# Patient Record
Sex: Male | Born: 1945
Health system: Southern US, Community
[De-identification: ages and names within clinical notes are randomized; demographics above are authoritative.]

## PROBLEM LIST (undated history)

## (undated) DIAGNOSIS — I10 Essential (primary) hypertension: Secondary | ICD-10-CM

## (undated) DIAGNOSIS — E119 Type 2 diabetes mellitus without complications: Secondary | ICD-10-CM

## (undated) DIAGNOSIS — I4891 Unspecified atrial fibrillation: Secondary | ICD-10-CM

---

## 2005-01-17 ENCOUNTER — Inpatient Hospital Stay (HOSPITAL_COMMUNITY): Admission: EM | Admit: 2005-01-17 | Discharge: 2005-01-19 | Payer: Self-pay | Admitting: Internal Medicine

## 2005-01-17 ENCOUNTER — Ambulatory Visit: Payer: Self-pay | Admitting: Cardiology

## 2005-01-24 ENCOUNTER — Inpatient Hospital Stay (HOSPITAL_COMMUNITY): Admission: AD | Admit: 2005-01-24 | Discharge: 2005-01-29 | Payer: Self-pay | Admitting: Cardiology

## 2005-01-24 ENCOUNTER — Ambulatory Visit: Payer: Self-pay | Admitting: Cardiology

## 2020-05-23 ENCOUNTER — Encounter (HOSPITAL_BASED_OUTPATIENT_CLINIC_OR_DEPARTMENT_OTHER): Payer: Self-pay | Admitting: Emergency Medicine

## 2020-05-23 ENCOUNTER — Other Ambulatory Visit: Payer: Self-pay

## 2020-05-23 ENCOUNTER — Emergency Department (HOSPITAL_BASED_OUTPATIENT_CLINIC_OR_DEPARTMENT_OTHER): Payer: Medicare Other

## 2020-05-23 ENCOUNTER — Inpatient Hospital Stay (HOSPITAL_BASED_OUTPATIENT_CLINIC_OR_DEPARTMENT_OTHER)
Admission: EM | Admit: 2020-05-23 | Discharge: 2020-05-25 | DRG: 308 | Disposition: A | Discharge status: Home / Self Care | Payer: Medicare Other | Attending: Internal Medicine | Admitting: Internal Medicine

## 2020-05-23 DIAGNOSIS — I110 Hypertensive heart disease with heart failure: Secondary | ICD-10-CM | POA: Diagnosis not present

## 2020-05-23 DIAGNOSIS — I248 Other forms of acute ischemic heart disease: Secondary | ICD-10-CM | POA: Diagnosis not present

## 2020-05-23 DIAGNOSIS — Z955 Presence of coronary angioplasty implant and graft: Secondary | ICD-10-CM

## 2020-05-23 DIAGNOSIS — R7989 Other specified abnormal findings of blood chemistry: Secondary | ICD-10-CM | POA: Diagnosis not present

## 2020-05-23 DIAGNOSIS — I509 Heart failure, unspecified: Secondary | ICD-10-CM | POA: Diagnosis not present

## 2020-05-23 DIAGNOSIS — I517 Cardiomegaly: Secondary | ICD-10-CM | POA: Diagnosis not present

## 2020-05-23 DIAGNOSIS — I313 Pericardial effusion (noninflammatory): Secondary | ICD-10-CM | POA: Diagnosis present

## 2020-05-23 DIAGNOSIS — E782 Mixed hyperlipidemia: Secondary | ICD-10-CM | POA: Diagnosis present

## 2020-05-23 DIAGNOSIS — I5033 Acute on chronic diastolic (congestive) heart failure: Secondary | ICD-10-CM | POA: Diagnosis not present

## 2020-05-23 DIAGNOSIS — I252 Old myocardial infarction: Secondary | ICD-10-CM | POA: Diagnosis not present

## 2020-05-23 DIAGNOSIS — E1165 Type 2 diabetes mellitus with hyperglycemia: Secondary | ICD-10-CM | POA: Diagnosis not present

## 2020-05-23 DIAGNOSIS — J811 Chronic pulmonary edema: Secondary | ICD-10-CM | POA: Diagnosis not present

## 2020-05-23 DIAGNOSIS — Z7982 Long term (current) use of aspirin: Secondary | ICD-10-CM | POA: Diagnosis not present

## 2020-05-23 DIAGNOSIS — I77810 Thoracic aortic ectasia: Secondary | ICD-10-CM | POA: Diagnosis not present

## 2020-05-23 DIAGNOSIS — I480 Paroxysmal atrial fibrillation: Secondary | ICD-10-CM | POA: Diagnosis not present

## 2020-05-23 DIAGNOSIS — I10 Essential (primary) hypertension: Secondary | ICD-10-CM

## 2020-05-23 DIAGNOSIS — Z7901 Long term (current) use of anticoagulants: Secondary | ICD-10-CM | POA: Diagnosis not present

## 2020-05-23 DIAGNOSIS — Z7984 Long term (current) use of oral hypoglycemic drugs: Secondary | ICD-10-CM

## 2020-05-23 DIAGNOSIS — I2510 Atherosclerotic heart disease of native coronary artery without angina pectoris: Secondary | ICD-10-CM | POA: Diagnosis not present

## 2020-05-23 DIAGNOSIS — Z79899 Other long term (current) drug therapy: Secondary | ICD-10-CM | POA: Diagnosis not present

## 2020-05-23 DIAGNOSIS — Z20822 Contact with and (suspected) exposure to covid-19: Secondary | ICD-10-CM | POA: Diagnosis present

## 2020-05-23 DIAGNOSIS — I5043 Acute on chronic combined systolic (congestive) and diastolic (congestive) heart failure: Secondary | ICD-10-CM | POA: Diagnosis present

## 2020-05-23 DIAGNOSIS — R778 Other specified abnormalities of plasma proteins: Secondary | ICD-10-CM | POA: Diagnosis not present

## 2020-05-23 DIAGNOSIS — I4891 Unspecified atrial fibrillation: Secondary | ICD-10-CM

## 2020-05-23 DIAGNOSIS — R0602 Shortness of breath: Secondary | ICD-10-CM | POA: Diagnosis not present

## 2020-05-23 DIAGNOSIS — Z9114 Patient's other noncompliance with medication regimen: Secondary | ICD-10-CM

## 2020-05-23 HISTORY — DX: Essential (primary) hypertension: I10

## 2020-05-23 HISTORY — DX: Unspecified atrial fibrillation: I48.91

## 2020-05-23 HISTORY — DX: Type 2 diabetes mellitus without complications: E11.9

## 2020-05-23 HISTORY — DX: Type 2 diabetes mellitus with hyperglycemia: E11.65

## 2020-05-23 HISTORY — DX: Atherosclerotic heart disease of native coronary artery without angina pectoris: I25.10

## 2020-05-23 HISTORY — DX: Mixed hyperlipidemia: E78.2

## 2020-05-23 LAB — RESP PANEL BY RT-PCR (FLU A&B, COVID) ARPGX2
Influenza A by PCR: NEGATIVE
Influenza B by PCR: NEGATIVE
SARS Coronavirus 2 by RT PCR: NEGATIVE

## 2020-05-23 LAB — CBC WITH DIFFERENTIAL/PLATELET
Abs Immature Granulocytes: 0.02 K/uL (ref 0.00–0.07)
Basophils Absolute: 0 K/uL (ref 0.0–0.1)
Basophils Relative: 0 %
Eosinophils Absolute: 0.2 K/uL (ref 0.0–0.5)
Eosinophils Relative: 2 %
HCT: 40.8 % (ref 39.0–52.0)
Hemoglobin: 13.2 g/dL (ref 13.0–17.0)
Immature Granulocytes: 0 %
Lymphocytes Relative: 20 %
Lymphs Abs: 1.5 K/uL (ref 0.7–4.0)
MCH: 30.1 pg (ref 26.0–34.0)
MCHC: 32.4 g/dL (ref 30.0–36.0)
MCV: 92.9 fL (ref 80.0–100.0)
Monocytes Absolute: 0.7 K/uL (ref 0.1–1.0)
Monocytes Relative: 9 %
Neutro Abs: 5.1 K/uL (ref 1.7–7.7)
Neutrophils Relative %: 69 %
Platelets: 180 K/uL (ref 150–400)
RBC: 4.39 MIL/uL (ref 4.22–5.81)
RDW: 14.8 % (ref 11.5–15.5)
WBC: 7.4 K/uL (ref 4.0–10.5)
nRBC: 0 % (ref 0.0–0.2)

## 2020-05-23 LAB — COMPREHENSIVE METABOLIC PANEL WITH GFR
ALT: 17 U/L (ref 0–44)
AST: 24 U/L (ref 15–41)
Albumin: 4.1 g/dL (ref 3.5–5.0)
Alkaline Phosphatase: 50 U/L (ref 38–126)
Anion gap: 12 (ref 5–15)
BUN: 13 mg/dL (ref 8–23)
CO2: 23 mmol/L (ref 22–32)
Calcium: 9.6 mg/dL (ref 8.9–10.3)
Chloride: 102 mmol/L (ref 98–111)
Creatinine, Ser: 0.65 mg/dL (ref 0.61–1.24)
GFR, Estimated: >60 mL/min (ref >60)
Glucose, Bld: 287 mg/dL — ABNORMAL HIGH (ref 70–99)
Potassium: 3.8 mmol/L (ref 3.5–5.1)
Sodium: 137 mmol/L (ref 135–145)
Total Bilirubin: 1.2 mg/dL (ref 0.3–1.2)
Total Protein: 7.1 g/dL (ref 6.5–8.1)

## 2020-05-23 LAB — BRAIN NATRIURETIC PEPTIDE: B Natriuretic Peptide: 170.2 pg/mL — ABNORMAL HIGH (ref 0.0–100.0)

## 2020-05-23 LAB — D-DIMER, QUANTITATIVE: D-Dimer, Quant: 0.64 ug/mL-FEU — ABNORMAL HIGH (ref 0.00–0.50)

## 2020-05-23 LAB — MAGNESIUM: Magnesium: 1.7 mg/dL (ref 1.7–2.4)

## 2020-05-23 LAB — TROPONIN I (HIGH SENSITIVITY)
Troponin I (High Sensitivity): 30 ng/L — ABNORMAL HIGH (ref <18)
Troponin I (High Sensitivity): 32 ng/L — ABNORMAL HIGH (ref <18)

## 2020-05-23 LAB — GLUCOSE, CAPILLARY: Glucose-Capillary: 256 mg/dL — ABNORMAL HIGH (ref 70–99)

## 2020-05-23 LAB — TSH: TSH: 2.19 u[IU]/mL (ref 0.350–4.500)

## 2020-05-23 MED ORDER — SODIUM CHLORIDE 0.9 % IV SOLN
INTRAVENOUS | Status: DC | PRN
  Administered 2020-05-23: 500 mL via INTRAVENOUS

## 2020-05-23 MED ORDER — HYDRALAZINE HCL 20 MG/ML IJ SOLN: 10.0000 mg | Freq: Four times a day (QID) | INTRAMUSCULAR | Status: DC | PRN

## 2020-05-23 MED ORDER — DULOXETINE HCL 60 MG PO CPEP
60.0000 mg | ORAL_CAPSULE | Freq: Every evening | ORAL | Status: DC
  Administered 2020-05-23 – 2020-05-24 (×2): 60 mg via ORAL
  Filled 2020-05-23 (×2): qty 1

## 2020-05-23 MED ORDER — DILTIAZEM HCL-DEXTROSE 125-5 MG/125ML-% IV SOLN (PREMIX)
5.0000 mg/h | INTRAVENOUS | Status: AC
  Administered 2020-05-23: 5 mg/h via INTRAVENOUS
  Filled 2020-05-23: qty 125

## 2020-05-23 MED ORDER — ONDANSETRON HCL 4 MG/2ML IJ SOLN: 4.0000 mg | Freq: Four times a day (QID) | INTRAMUSCULAR | Status: DC | PRN

## 2020-05-23 MED ORDER — MAGNESIUM SULFATE IN D5W 1-5 GM/100ML-% IV SOLN
1.0000 g | Freq: Once | INTRAVENOUS | Status: AC
  Administered 2020-05-23: 1 g via INTRAVENOUS
  Filled 2020-05-23: qty 100

## 2020-05-23 MED ORDER — GABAPENTIN 600 MG PO TABS
600.0000 mg | ORAL_TABLET | Freq: Three times a day (TID) | ORAL | Status: DC
  Administered 2020-05-23 – 2020-05-25 (×5): 600 mg via ORAL
  Filled 2020-05-23 (×5): qty 1

## 2020-05-23 MED ORDER — POTASSIUM CHLORIDE CRYS ER 20 MEQ PO TBCR
20.0000 meq | EXTENDED_RELEASE_TABLET | Freq: Once | ORAL | Status: AC
  Administered 2020-05-23: 20 meq via ORAL
  Filled 2020-05-23: qty 1

## 2020-05-23 MED ORDER — FUROSEMIDE 10 MG/ML IJ SOLN
40.0000 mg | Freq: Once | INTRAMUSCULAR | Status: AC
  Administered 2020-05-23: 40 mg via INTRAVENOUS
  Filled 2020-05-23: qty 4

## 2020-05-23 MED ORDER — INSULIN ASPART 100 UNIT/ML ~~LOC~~ SOLN
0.0000 [IU] | Freq: Three times a day (TID) | SUBCUTANEOUS | Status: DC
  Administered 2020-05-23: 8 [IU] via SUBCUTANEOUS
  Administered 2020-05-24: 2 [IU] via SUBCUTANEOUS
  Administered 2020-05-24 (×2): 3 [IU] via SUBCUTANEOUS
  Administered 2020-05-25: 5 [IU] via SUBCUTANEOUS
  Administered 2020-05-25: 3 [IU] via SUBCUTANEOUS

## 2020-05-23 MED ORDER — POLYETHYLENE GLYCOL 3350 17 G PO PACK: 17.0000 g | PACK | Freq: Every day | ORAL | Status: DC | PRN

## 2020-05-23 MED ORDER — APIXABAN 5 MG PO TABS
5.0000 mg | ORAL_TABLET | Freq: Two times a day (BID) | ORAL | Status: DC
  Administered 2020-05-23 – 2020-05-25 (×4): 5 mg via ORAL
  Filled 2020-05-23 (×5): qty 1

## 2020-05-23 MED ORDER — METOPROLOL TARTRATE 50 MG PO TABS
50.0000 mg | ORAL_TABLET | Freq: Two times a day (BID) | ORAL | Status: DC
  Administered 2020-05-23 – 2020-05-25 (×4): 50 mg via ORAL
  Filled 2020-05-23 (×4): qty 1

## 2020-05-23 MED ORDER — ADULT MULTIVITAMIN W/MINERALS CH
1.0000 | ORAL_TABLET | Freq: Every day | ORAL | Status: DC
  Administered 2020-05-24 – 2020-05-25 (×2): 1 via ORAL
  Filled 2020-05-23 (×2): qty 1

## 2020-05-23 MED ORDER — DILTIAZEM HCL 25 MG/5ML IV SOLN
10.0000 mg | Freq: Once | INTRAVENOUS | Status: AC
  Administered 2020-05-23: 10 mg via INTRAVENOUS

## 2020-05-23 MED ORDER — INSULIN GLARGINE 100 UNIT/ML ~~LOC~~ SOLN
10.0000 [IU] | Freq: Every day | SUBCUTANEOUS | Status: DC
  Administered 2020-05-23 – 2020-05-24 (×2): 10 [IU] via SUBCUTANEOUS
  Filled 2020-05-23 (×3): qty 0.1

## 2020-05-23 MED ORDER — ACETAMINOPHEN 325 MG PO TABS: 650.0000 mg | ORAL_TABLET | ORAL | Status: DC | PRN

## 2020-05-23 MED ORDER — ATORVASTATIN CALCIUM 40 MG PO TABS
40.0000 mg | ORAL_TABLET | Freq: Every evening | ORAL | Status: DC
  Administered 2020-05-23 – 2020-05-24 (×2): 40 mg via ORAL
  Filled 2020-05-23 (×2): qty 1

## 2020-05-23 MED ORDER — DILTIAZEM LOAD VIA INFUSION
20.0000 mg | Freq: Once | INTRAVENOUS | Status: DC
  Filled 2020-05-23: qty 20

## 2020-05-23 NOTE — ED Provider Notes (Signed)
[place] EMERGENCY DEPARTMENT Provider Note   CSN: [id number] Arrival date & time: 05/23/20  1001     History Chief Complaint  Patient presents with  . Shortness of Breath    Austin Gomez is a 75 y.o. male.  HPI 75 year old male presents with shortness of breath.  History is obtained through the Spanish interpreter via the patient as well as from the daughter at the bedside.  The patient recently came here from [place].  He was recently admitted to the hospital a few weeks ago and sounds like he had some stents placed.  He is on numerous medicines and he has a history of hypertension, diabetes, cardiac disease, atrial fibrillation (at least for the last 2 weeks) and CHF.  He endorses dyspnea for at least a week but much worse today.  He does not feel palpitations but is noted to be in A. fib with RVR.  He reports compliance with his medicines.  He is having some chest pressure over the last couple days but worse today. Some leg swelling. Just moved to the area but does not have local doctors.  Past Medical History:  Diagnosis Date  . A-fib ([place])   . Coronary artery disease   . Diabetes mellitus without complication ([place])   . Hypertension     Patient Active Problem List   Diagnosis Date Noted  . Atrial fibrillation with RVR ([place]) 05/23/2020         No family history on file.  Social History   Tobacco Use  . Smoking status: Never Smoker  . Smokeless tobacco: Never Used    Home Medications Prior to Admission medications   Not on File    Allergies    Patient has no known allergies.  Review of Systems   Review of Systems  Respiratory: Positive for shortness of breath.   Cardiovascular: Positive for chest pain and leg swelling. Negative for palpitations.  All other systems reviewed and are negative.   Physical Exam Updated Vital Signs BP 126/82   Pulse 98   Temp 97.7 F (36.5 C) (Oral)   Resp (!) 28   Ht 5\' 10"  (1.778 m)   Wt 97.9 kg    SpO2 98%   BMI 30.98 kg/m   Physical Exam Vitals and nursing note reviewed.  Constitutional:      Appearance: He is well-developed.  HENT:     Head: Normocephalic and atraumatic.     Right Ear: External ear normal.     Left Ear: External ear normal.     Nose: Nose normal.  Eyes:     General:        Right eye: No discharge.        Left eye: No discharge.  Cardiovascular:     Rate and Rhythm: Tachycardia present. Rhythm irregular.     Heart sounds: Normal heart sounds.  Pulmonary:     Effort: Pulmonary effort is normal. Tachypnea present. No accessory muscle usage or respiratory distress.     Breath sounds: Examination of the right-lower field reveals rales. Examination of the left-lower field reveals rales. Rales present.  Abdominal:     Palpations: Abdomen is soft.     Tenderness: There is no abdominal tenderness.  Musculoskeletal:     Cervical back: Neck supple.     Right lower leg: Edema present.     Left lower leg: Edema present.     Comments: Mild bilateral lower extremity edema  Skin:    General: Skin is  warm and dry.  Neurological:     Mental Status: He is alert.  Psychiatric:        Mood and Affect: Mood is not anxious.     ED Results / Procedures / Treatments   Labs (all labs ordered are listed, but only abnormal results are displayed) Labs Reviewed  COMPREHENSIVE METABOLIC PANEL - Abnormal; Notable for the following components:      Result Value   Glucose, Bld 287 (*)    All other components within normal limits  BRAIN NATRIURETIC PEPTIDE - Abnormal; Notable for the following components:   B Natriuretic Peptide 170.2 (*)    All other components within normal limits  TROPONIN I (HIGH SENSITIVITY) - Abnormal; Notable for the following components:   Troponin I (High Sensitivity) 30 (*)    All other components within normal limits  TROPONIN I (HIGH SENSITIVITY) - Abnormal; Notable for the following components:   Troponin I (High Sensitivity) 32 (*)    All  other components within normal limits  RESP PANEL BY RT-PCR (FLU A&B, COVID) [id number]  CBC WITH DIFFERENTIAL/PLATELET    EKG EKG Interpretation  Date/Time:  Wednesday May 23 2020 10:07:51 EDT Ventricular Rate:  137 PR Interval:    QRS Duration: 95 QT Interval:  332 QTC Calculation: 502 R Axis:   -45 Text Interpretation: Atrial fibrillation with RVR LAD, consider left anterior fascicular block Prolonged QT interval afib is new compared to 2007 Confirmed by Sherwood Gambler [phone]) on 05/23/2020 10:11:37 AM   Radiology DG Chest Portable 1 View  Result Date: 05/23/2020 CLINICAL DATA:  Shortness of breath.  Atrial fibrillation. EXAM: PORTABLE CHEST 1 VIEW COMPARISON:  01/27/2005 FINDINGS: Heart is enlarged. There is aortic atherosclerotic calcification. There is interstitial and early alveolar pulmonary edema. Possible small effusions. IMPRESSION: Congestive heart failure with interstitial and early alveolar pulmonary edema. Possible small effusions. Electronically Signed   By: Nelson Chimes M.D.   On: 05/23/2020 10:53    Procedures .Critical Care Performed by: Sherwood Gambler, MD Authorized by: Sherwood Gambler, MD   Critical care provider statement:    Critical care time (minutes):  35   Critical care time was exclusive of:  Separately billable procedures and treating other patients   Critical care was time spent personally by me on the following activities:  Discussions with consultants, evaluation of patient's response to treatment, examination of patient, ordering and performing treatments and interventions, ordering and review of laboratory studies, ordering and review of radiographic studies, pulse oximetry, re-evaluation of patient's condition, obtaining history from patient or surrogate and review of old charts     Medications Ordered in ED Medications  diltiazem (CARDIZEM) 125 mg in dextrose 5% 125 mL (1 mg/mL) infusion (5 mg/hr Intravenous New Bag/Given 05/23/20 1116)  0.9 %   sodium chloride infusion (500 mLs Intravenous New Bag/Given 05/23/20 1113)  diltiazem (CARDIZEM) injection 10 mg (10 mg Intravenous Given 05/23/20 1117)  furosemide (LASIX) injection 40 mg (40 mg Intravenous Given 05/23/20 1147)    ED Course  I have reviewed the triage vital signs and the nursing notes.  Pertinent labs & imaging results that were available during my care of the patient were reviewed by me and considered in my medical decision making (see chart for details).    MDM Rules/Calculators/A&P                          Patient's heart rate has been better controlled with diltiazem bolus and drip.  He was given IV Lasix for what appears to be CHF as well.  History is limited to a degree because there is no outside records and he will need this confirmed when he gets to [facility].  Otherwise he appears stable for transfer to [facility] for admission for diuresis and heart rate control and further work-up and treatment.  Discussed with hospitalist for admission (Dr. Velia Meyer). Final Clinical Impression(s) / ED Diagnoses Final diagnoses:  Atrial fibrillation with RVR ([place])  Acute congestive heart failure, unspecified heart failure type [facility])    Rx / DC Orders ED Discharge Orders    None       Sherwood Gambler, MD 05/23/20 1456

## 2020-05-23 NOTE — ED Notes (Signed)
Family members given water and snacks - per request

## 2020-05-23 NOTE — Progress Notes (Signed)
Takes Ozempic 1 unit once a week

## 2020-05-23 NOTE — ED Notes (Signed)
ED Provider at bedside. 

## 2020-05-23 NOTE — H&P (Signed)
History and Physical    Austin Gomez OEV:[id number] DOB: Nov 02, 1945 DOA: 05/23/2020  PCP: Pcp, No  Patient coming from: Home   Chief Complaint:  Chief Complaint  Patient presents with  . Shortness of Breath     HPI:    75 year old male with past medical history of paroxysmal atrial fibrillation, diastolic congestive heart failure, non-insulin-dependent diabetes mellitus type 2, hyperlipidemia, hypertension and coronary artery disease (S/P caths with stenting in 2006 and 02/2020 in [place] [place]) presenting to [facility] emergency department with complaints of shortness of breath.  Patient explains that he was diagnosed with paroxysmal atrial fibrillation nearly 2 decades ago, perhaps more.  In 2006 the patient underwent a cardiac catheterization with stent placement and states that after that his episodes of atrial fibrillation had resolved.  Of note, [facility] in February due to myocardial infarction requiring placement of 2 more stents.  In the past several weeks, the patient has noted that he has been developing bruising of the bilateral upper extremities and approximately 5 days ago stopped taking his metoprolol Eliquis and aspirin because of this.  Patient explains that since then, patient has been experiencing shortness of breath.  Patient describes that this shortness of breath as initially mild in intensity but has progressively worsened and is now severe in intensity.  Shortness of breath is worse with exertion and improved with rest.  Shortness of breath is been associated with bilateral lower extremity edema.  Patient denies any associated cough fever sick contacts, recent travel or confirmed contact with COVID-19 infection.  Upon further questioning patient does additionally complain of associated mild chest discomfort, primarily located in the right anterior chest and typically occurring with taking a deep  breath.  Because of this progressive constellation of symptoms the patient eventually presented to [facility] emergency department for evaluation.  Upon evaluation in the emergency department patient was found to be in rapid atrial fibrillation with initial heart rates noted to be as high as the 140s.  Patient was additionally found to have slightly elevated troponins of 30 and 32. Patient was initiated on a diltiazem infusion.  And the hospitalist group was then called to assess the patient for transfer to [facility] for further evaluation and management.  Patient was then accepted to a progressive bed at [facility].  Review of Systems:   Review of Systems  Respiratory: Positive for shortness of breath.   Cardiovascular: Positive for chest pain and leg swelling.  All other systems reviewed and are negative.   Past Medical History:  Diagnosis Date  . A-fib ([place])   . Coronary artery disease involving native coronary artery of native heart 05/23/2020  . Diabetes mellitus without complication ([place])   . Essential hypertension 05/23/2020  . Hypertension   . Mixed hyperlipidemia 05/23/2020  . Type 2 diabetes mellitus with hyperglycemia, without long-term current use of insulin ([place]) 05/23/2020    Past Surgical History:  Procedure Laterality Date  . CORONARY ANGIOPLASTY WITH STENT PLACEMENT       reports that he has never smoked. He has never used smokeless tobacco. No history on file for alcohol use and drug use.  No Known Allergies  Family History  Problem Relation Age of Onset  . Heart disease Neg Hx      Prior to Admission medications   Medication Sig Start Date End Date Taking? Authorizing Provider  amLODipine (NORVASC) 5 MG tablet Take 1  tablet by mouth daily. 03/13/20  Yes [provider]  aspirin 81 MG chewable tablet Chew 81 mg by mouth daily.   Yes [provider]  atorvastatin (LIPITOR) 40 MG tablet Take 40 mg by mouth every  evening.   Yes [provider]  DULoxetine (CYMBALTA) 60 MG capsule Take 60 mg by mouth every evening.   Yes [provider]  ELIQUIS 5 MG TABS tablet Take 1 tablet by mouth 2 (two) times daily. 05/14/20  Yes [provider]  furosemide (LASIX) 20 MG tablet Take 20 mg by mouth daily as needed for fluid or edema.   Yes [provider]  gabapentin (NEURONTIN) 600 MG tablet Take 600 mg by mouth 3 (three) times daily.   Yes [provider]  glipiZIDE (GLUCOTROL) 10 MG tablet Take 20 mg by mouth 2 (two) times daily before a meal.   Yes [provider]  metFORMIN (GLUCOPHAGE) 1000 MG tablet Take 1,000 mg by mouth 2 (two) times daily with a meal.   Yes [provider]  metoprolol tartrate (LOPRESSOR) 50 MG tablet Take 50 mg by mouth 2 (two) times daily.   Yes [provider]  Multiple Vitamins-Minerals (MULTIVITAMIN WITH MINERALS) tablet Take 1 tablet by mouth daily.   Yes [provider]  OZEMPIC, 1 MG/DOSE, 4 MG/3ML SOPN Inject 1 mg into the skin once a week. 05/18/20  Yes [provider]    Physical Exam: Vitals:   05/23/20 1700 05/23/20 1800 05/23/20 1900 05/23/20 2016  BP: (!) 149/88  132/81 122/79  Pulse: 94 95 88 85  Resp: (!) 26 (!) 28 (!) 27 20  Temp:    97.9 F (36.6 C)  TempSrc:    Oral  SpO2: 96% 95% 96% 97%  Weight:      Height:        Constitutional: Awake alert and oriented x3, no associated distress.   Skin: no rashes, no lesions, good skin turgor noted. Eyes: Pupils are equally reactive to light.  No evidence of scleral icterus or conjunctival pallor.  ENMT: Moist mucous membranes noted.  Posterior pharynx clear of any exudate or lesions.   Neck: normal, supple, no masses, no thyromegaly.  Slightly elevated jugular venous pulse at 30 degrees. Respiratory: Mild bibasilar rales noted.  No evidence of wheezing normal respiratory effort. No accessory muscle use.  Cardiovascular: Irregularly  irregular rate and rhythm, no murmurs / rubs / gallops.  +1 distal bilateral lower extremity pitting edema.. 2+ pedal pulses. No carotid bruits.  Chest:   Nontender without crepitus or deformity.   Back:   Nontender without crepitus or deformity. Abdomen: Abdomen is soft and nontender.  No evidence of intra-abdominal masses.  Positive bowel sounds noted in all quadrants.   Musculoskeletal: No joint deformity upper and lower extremities. Good ROM, no contractures. Normal muscle tone.  Neurologic: CN 2-12 grossly intact. Sensation intact.  Patient moving all 4 extremities spontaneously.  Patient is following all commands.  Patient is responsive to verbal stimuli.   Psychiatric: Patient exhibits normal mood with appropriate affect.  Patient seems to possess insight as to their current situation.     Labs on Admission: I have personally reviewed following labs and imaging studies -   CBC: Recent Labs  Lab 05/23/20 1018  WBC 7.4  NEUTROABS 5.1  HGB 13.2  HCT 40.8  MCV 92.9  PLT 299   Basic Metabolic Panel: Recent Labs  Lab 05/23/20 1018 05/23/20 2040  NA 137  --  K 3.8  --   CL 102  --   CO2 23  --   GLUCOSE 287*  --   BUN 13  --   CREATININE 0.65  --   CALCIUM 9.6  --   MG  --  1.7   GFR: Estimated Creatinine Clearance: 92.2 mL/min (by C-G formula based on SCr of 0.65 mg/dL). Liver Function Tests: Recent Labs  Lab 05/23/20 1018  AST 24  ALT 17  ALKPHOS 50  BILITOT 1.2  PROT 7.1  ALBUMIN 4.1   No results for input(s): LIPASE, AMYLASE in the last 168 hours. No results for input(s): AMMONIA in the last 168 hours. Coagulation Profile: No results for input(s): INR, PROTIME in the last 168 hours. Cardiac Enzymes: No results for input(s): CKTOTAL, CKMB, CKMBINDEX, TROPONINI in the last 168 hours. BNP (last 3 results) No results for input(s): PROBNP in the last 8760 hours. HbA1C: No results for input(s): HGBA1C in the last 72 hours. CBG: No results for input(s):  GLUCAP in the last 168 hours. Lipid Profile: No results for input(s): CHOL, HDL, LDLCALC, TRIG, CHOLHDL, LDLDIRECT in the last 72 hours. Thyroid Function Tests: Recent Labs    05/23/20 2040  TSH 2.190   Anemia Panel: No results for input(s): VITAMINB12, FOLATE, FERRITIN, TIBC, IRON, RETICCTPCT in the last 72 hours. Urine analysis: No results found for: COLORURINE, APPEARANCEUR, LABSPEC, PHURINE, GLUCOSEU, HGBUR, BILIRUBINUR, KETONESUR, PROTEINUR, UROBILINOGEN, NITRITE, LEUKOCYTESUR  Radiological Exams on Admission - Personally Reviewed: DG Chest Portable 1 View  Result Date: 05/23/2020 CLINICAL DATA:  Shortness of breath.  Atrial fibrillation. EXAM: PORTABLE CHEST 1 VIEW COMPARISON:  01/27/2005 FINDINGS: Heart is enlarged. There is aortic atherosclerotic calcification. There is interstitial and early alveolar pulmonary edema. Possible small effusions. IMPRESSION: Congestive heart failure with interstitial and early alveolar pulmonary edema. Possible small effusions. Electronically Signed   By: Nelson Chimes M.D.   On: 05/23/2020 10:53    EKG: Personally reviewed.  Rhythm is relation with rapid ventricular response with heart rate of 137 bpm.  No dynamic ST segment changes appreciated.  Assessment/Plan Principal Problem:   Paroxysmal atrial fibrillation with RVR ([place])   Patient presenting initially in rapid atrial fibrillation with heart rates in excess of 140  Etiology is felt to be secondary to medication noncompliance over approximate the past week  Patient seems to be responding very well to the diltiazem infusion.  After several hours of monitoring with the patient remains rate controlled we will attempt to transition patient back to home regimen of oral metoprolol.  We will continue home regimen of oral Eliquis for anticoagulation  D-dimer obtained and found to be minimally elevated, plaque rupture felt to be unlikely.  Magnesium only slightly low at 1.7, magnesium sulfate  will be provided.  TSH unremarkable  Troponins are slightly elevated but this is more likely secondary to supply demand mismatch secondary to accelerated rate.  Obtaining echocardiogram in the morning  Obtaining formal cardiology consultation in the morning   Active Problems:   Acute on chronic diastolic CHF (congestive heart failure) ([place])   Patient exhibiting trace pedal edema, slightly elevated jugular venous pulse and mild bibasilar rales on examination.   Patient found to have mild interstitial edema on chest x-ray with slightly elevated BNP of 170  To be diuresing well with 40 mg of intravenous Lasix provided by the emergency department  We will reassess volume status in the morning to determine if further doses of diuretics are necessary.  Strict input and output  monitoring  Monitoring renal function and electrolytes with serial chemistries  Daily weights    Essential hypertension   Holding home regimen of amlodipine due to ongoing diltiazem infusion  Restarting home regimen of oral metoprolol  As needed intravenous antihypertensives for markedly elevated blood pressures.    Mixed hyperlipidemia   Continuing home regimen of Lipitor    Type 2 diabetes mellitus with hyperglycemia, without long-term current use of insulin ([place])  . Patient been placed on Accu-Cheks before every meal and nightly with sliding scale insulin . Holding home regimen of hypoglycemics . Hemoglobin A1C ordered . Diabetic Diet    Coronary artery disease involving native coronary artery of native heart   Patient reports undergoing cardiac catheterization in 2006 with stent placement  Patient additionally reports experiencing a myocardial infarction in [facility] requiring catheterization and placement of 2 more stents  While patient did co  plain of some intermittent right-sided chest discomfort prior to arrival, patient is currently chest pain-free  Slightly elevated  troponins are more likely supply demand stanched secondary to elevated rate and not secondary to new plaque rupture  Resuming home regimen of Eliquis and aspirin  Continuing home regimen of statin therapy  Resuming home regimen of metoprolol  Monitoring patient on telemetry     Code Status:  Full code Family Communication: deferred   Status is: Inpatient  Remains inpatient appropriate because:Ongoing diagnostic testing needed not appropriate for outpatient work up, IV treatments appropriate due to intensity of illness or inability to take PO and Inpatient level of care appropriate due to severity of illness   Dispo: The patient is from: Home              Anticipated d/c is to: Home              Patient currently is not medically stable to d/c.   Difficult to place patient No        Vernelle Emerald MD [facility] Pager [phone]  If 7PM-7AM, please contact night-coverage [email or web address] Use universal [place] password for that web site. If you do not have the password, please call the hospital operator.  05/23/2020, 9:53 PM

## 2020-05-23 NOTE — Progress Notes (Signed)
Patient: Austin Gomez, Austin Gomez (DOB 06/20/1945) MRN: [id number]  I discussed the following case with Dr. Regenia Skeeter (emergency department physician at [facility]), who contacted the call-center requesting transfer of the above patient from [facility] ED to [facility] for further work-up and management of atrial fibrillation with RVR and acute on chronic diastolic heart failure.   Austin Gomez 75 year old male with medical history notable for paroxysmal atrial fibrillation chronically anticoagulated on Eliquis, chronic diastolic heart failure, type 2 diabetes mellitus, who presented to [facility] ED on 05/23/20 complaining of 1 week of progressive shortness of breath associated with intermittent chest pressure.  Reportedly chest pain-free at this time.  Over the last few weeks, the patient has moved from [facility] to [facility], rendering mended medical records currently available in [facility].  Over the patient reports that he has a history of chronic heart failure, atrial fibrillation for which he is compliant with chronic anticoagulation on Eliquis, and history of type 2 diabetes mellitus for which she takes metformin.    VS in the ED were notable for the following: Afebrile; initial heart rate in the 140s, which is improved to the low 100s on diltiazem drip.  Most recent blood pressure on diltiazem drip 140/88; respiratory rate 27, oxygen saturation 98 to 99% on room air.  Labs were notable for CMP notable for bicarbonate 23, anion gap 12, glucose 287.  BNP 170 and Trop I x 1 found to be 30, with no prior available corresponding data points for point of comparison.  COVID-19 PCR performed at [facility] ED today found to be negative.  Imaging notable for EKG showed atrial fibrillation with RVR and ventricular rate of 144, without evidence of T wave or ST changes.  Chest x-ray showed interstitial edema and early pulmonary edema with possible small pleural effusions.  Medications administered prior to  transfer included the following: Diltiazem 10 mg IV x1 followed by initiation diltiazem drip, upon which the patient remains at this time.  Lasix 40 mg IV x1.   Subsequently, I accepted this patient for transfer to a step-down unit bed at [facility] for further work-up and management of atrial fibrillation with RVR and acute on chronic diastolic heart failure.   Of note, the patient was brought with him a bag of home medications, which will need to be reconciled for accurate outpatient med rec   Austin Bertin, DO Hospitalist

## 2020-05-24 ENCOUNTER — Inpatient Hospital Stay (HOSPITAL_COMMUNITY): Payer: Medicare Other

## 2020-05-24 DIAGNOSIS — I4891 Unspecified atrial fibrillation: Secondary | ICD-10-CM

## 2020-05-24 DIAGNOSIS — I480 Paroxysmal atrial fibrillation: Principal | ICD-10-CM

## 2020-05-24 LAB — ECHOCARDIOGRAM COMPLETE
AR max vel: 2.5 cm2
AV Area VTI: 2.51 cm2
AV Area mean vel: 2.3 cm2
AV Mean grad: 4 mmHg
AV Peak grad: 6.1 mmHg
Ao pk vel: 1.24 m/s
Calc EF: 40.5 %
Height: 70 in
S' Lateral: 3.9 cm
Single Plane A2C EF: 38.5 %
Single Plane A4C EF: 41.4 %
Weight: 3454.4 oz

## 2020-05-24 LAB — GLUCOSE, CAPILLARY
Glucose-Capillary: 188 mg/dL — ABNORMAL HIGH (ref 70–99)
Glucose-Capillary: 200 mg/dL — ABNORMAL HIGH (ref 70–99)
Glucose-Capillary: 146 mg/dL — ABNORMAL HIGH (ref 70–99)
Glucose-Capillary: 206 mg/dL — ABNORMAL HIGH (ref 70–99)

## 2020-05-24 LAB — HEMOGLOBIN A1C
Hgb A1c MFr Bld: 7.7 % — ABNORMAL HIGH (ref 4.8–5.6)
Mean Plasma Glucose: 174.29 mg/dL

## 2020-05-24 MED ORDER — CLOPIDOGREL BISULFATE 75 MG PO TABS
75.0000 mg | ORAL_TABLET | Freq: Every day | ORAL | Status: DC
  Administered 2020-05-24 – 2020-05-25 (×2): 75 mg via ORAL
  Filled 2020-05-24 (×2): qty 1

## 2020-05-24 MED ORDER — FUROSEMIDE 20 MG PO TABS
20.0000 mg | ORAL_TABLET | Freq: Every day | ORAL | Status: DC
  Administered 2020-05-24 – 2020-05-25 (×2): 20 mg via ORAL
  Filled 2020-05-24 (×2): qty 1

## 2020-05-24 MED ORDER — LOSARTAN POTASSIUM 25 MG PO TABS
25.0000 mg | ORAL_TABLET | Freq: Every day | ORAL | Status: DC
  Administered 2020-05-24 – 2020-05-25 (×2): 25 mg via ORAL
  Filled 2020-05-24 (×2): qty 1

## 2020-05-24 NOTE — Progress Notes (Signed)
PROGRESS NOTE    Austin Gomez  WUJ:[id number] DOB: 02-10-1945 DOA: 05/23/2020 PCP: Maggie Schwalbe, PA-C    Brief Narrative:  Austin Gomez is a 75 year old male with past medical history significant for paroxysmal atrial fibrillation, chronic diastolic congestive heart failure, non-insulin-dependent diabetes mellitus, HLD, essential hypertension, CAD s/p PCI 2006 and 02/2020 in [place] who presented to the emergency department with complaints of shortness of breath.  Over the past several weeks, patient has been noting bruising to bilateral upper extremities and approximately 5 days ago he stopped taking his metoprolol, Eliquis and aspirin because of this.  Also he reports has stopped taking his Plavix even with recent PCI/stent placement in February 2022.  Shortness of breath is worse with exertion and improved with rest.  Also associated with increased bilateral lower extremity edema.  Patient denies any sick contacts, recent travel.  Patient also was prescribed Lasix to use as needed, used 2 doses initially and has since discarded as "medication not effective".  In the ED, temperature 97.7 F, HR 144, RR 27, BP 162/115, SPO2 96% on room air.  Sodium 137, potassium 3.8, chloride 102, CO2 23, glucose 287, BUN 13, creatinine 0.65.  High-sensitivity troponin 30>32.  BNP 170.2.  WBC 7.4, hemoglobin 13.2, platelets 180.  TSH 2.190, D-dimer 0.64.  COVID-19 PCR negative.  Influenza A/B PCR negative.  Chest x-ray with interstitial and early alveolar pulmonary edema.  Hospitalist service consulted for further evaluation and management.   Assessment & Plan:   Principal Problem:   Paroxysmal atrial fibrillation with RVR (HCC) Active Problems:   Acute on chronic diastolic CHF (congestive heart failure) ([place])   Essential hypertension   Mixed hyperlipidemia   Type 2 diabetes mellitus with hyperglycemia, without long-term current use of insulin (HCC)   Coronary artery disease involving native  coronary artery of native heart   Elevated troponin level not due myocardial infarction   Paroxysmal atrial fibrillation with RVR Patient presenting to the hospital with progressive shortness of breath.  Recently stopped taking his metoprolol and Eliquis.  On arrival, patient was noted to have an elevated heart rate of 144.  Initially started on a Cardizem drip and his home metoprolol was restarted, with ability to titrate off of Cardizem drip. -- Continue metoprolol tartrate 50 mg p.o. twice daily -- Eliquis 5 mg p.o. twice daily -- Ambulatory referral placed to establish care with cardiology in [place] per patient/daughter request -- Continue to monitor on telemetry  Acute on chronic combined systolic/diastolic congestive heart failure Patient presenting with progressive shortness of breath.  Elevated BNP and chest x-ray findings consistent with pulmonary edema.  Received 1 dose of furosemide 40 mg IV in the ED.  Patient reports has discontinued his home furosemide for unclear reasons.  TTE with LVEF 40-45%, LV mildly decreased function, LV global hypokinesis with moderate asymmetric left ventricular hypertrophy basal septal segment, RV systolic function mildly reduced, RV mildly enlarged, LA/RA mildly dilated, small pericardial effusion circumferential, mild AV stenosis, mild MR, mild dilation aortic root 42 mm, IVC normal in size. -- Metoprolol 50 mg BID -- Losartan 25 mg p.o. daily -- Furosemide 20 mg p.o. daily -- Strict I's and O's and daily weights -- BMP daily  Essential hypertension -- Metoprolol tartrate 50 mg p.o. twice daily, losartan 25 mg p.o. daily  Type 2 diabetes mellitus with hyperglycemia On metformin 1000 mg p.o. twice daily outpatient.  Hemoglobin A1c 7.7. -- Hold home metformin while inpatient -- Lantus 10 unit subcutaneously nightly -- SSI for  further coverage -- CBGs qAC/HS  CAD s/p PCI/stent 02/2020 Patient with initial PCI/stent 2006 [facility].  Patient was prescribed Plavix in which he discontinued due to bruising. -- Resume Plavix 75 mg p.o. daily -- Outpatient follow-up with cardiology  Elevated troponin Patient presented with progressive shortness of breath likely secondary to decompensated CHF exacerbation and A. fib with RVR as above.  High sensitive troponin flat at 30>32.  Etiology likely secondary to type II demand ischemia.  Currently chest pain-free. -- Continue to monitor on telemetry   DVT prophylaxis: Eliquis   Code Status: Full Code Family Communication: Updated patient's daughter present at bedside  Disposition Plan:  Level of care: Progressive Status is: Inpatient  Remains inpatient appropriate because:Ongoing diagnostic testing needed not appropriate for outpatient work up, Unsafe d/c plan, IV treatments appropriate due to intensity of illness or inability to take PO and Inpatient level of care appropriate due to severity of illness   Dispo: The patient is from: Home              Anticipated d/c is to: Home              Patient currently is not medically stable to d/c.   Difficult to place patient No    Consultants:   none  Procedures:   TTE  Antimicrobials:   none   Subjective: Patient seen and examined at bedside, resting comfortably.  Daughter present.  Patient reports shortness of breath much improved after receiving IV Lasix yesterday.  Discussed with patient and daughter that he needs to be on his cardiac medications to include metoprolol for his rate control of A. fib and Eliquis and Plavix with recent stent placement and to reduce risk for blood clots.  Patient seems to have better understanding regarding this.  Given recent move much of his care has been put on hold.  Daughter seems to have good understanding regarding plan moving forward.  Heart rate better controlled on telemetry this morning, remains in atrial fibrillation.  No other  questions concerns or complaints at this time.  Patient denies headache, no visual changes, no chest pain, no palpitations, no nausea/vomiting/diarrhea, no fever/chills/night sweats, no abdominal pain, no weakness, no fatigue, no paresthesias.  No acute events overnight per nursing staff.  Objective: Vitals:   05/23/20 2356 05/24/20 0500 05/24/20 0751 05/24/20 0954  BP: 125/81 138/80 130/88   Pulse: 84 75  86  Resp: 18 20 20    Temp: 97.8 F (36.6 C) 97.6 F (36.4 C) 97.7 F (36.5 C)   TempSrc: Oral Oral Oral   SpO2: 91% 94% 94%   Weight:      Height:        Intake/Output Summary (Last 24 hours) at 05/24/2020 1150 Last data filed at 05/24/2020 0501 Gross per 24 hour  Intake --  Output 400 ml  Net -400 ml   Filed Weights   05/23/20 1026  Weight: 97.9 kg    Examination:  General exam: Appears calm and comfortable  Respiratory system: Clear to auscultation. Respiratory effort normal.  On room air Cardiovascular system: S1 & S2 heard, irregularly irregular rhythm, normal rate. No JVD, murmurs, rubs, gallops or clicks.  Trace pedal edema bilaterally Gastrointestinal system: Abdomen is nondistended, soft and nontender. No organomegaly or masses felt. Normal bowel sounds heard. Central nervous system: Alert and oriented. No focal neurological deficits. Extremities: Symmetric 5 x 5 power. Skin: No rashes, lesions or  ulcers Psychiatry: Judgement and insight appear normal. Mood & affect appropriate.     Data Reviewed: I have personally reviewed following labs and imaging studies  CBC: Recent Labs  Lab 05/23/20 1018  WBC 7.4  NEUTROABS 5.1  HGB 13.2  HCT 40.8  MCV 92.9  PLT 101   Basic Metabolic Panel: Recent Labs  Lab 05/23/20 1018 05/23/20 2040  NA 137  --   K 3.8  --   CL 102  --   CO2 23  --   GLUCOSE 287*  --   BUN 13  --   CREATININE 0.65  --   CALCIUM 9.6  --   MG  --  1.7   GFR: Estimated Creatinine Clearance: 92.2 mL/min (by C-G formula based on SCr  of 0.65 mg/dL). Liver Function Tests: Recent Labs  Lab 05/23/20 1018  AST 24  ALT 17  ALKPHOS 50  BILITOT 1.2  PROT 7.1  ALBUMIN 4.1   No results for input(s): LIPASE, AMYLASE in the last 168 hours. No results for input(s): AMMONIA in the last 168 hours. Coagulation Profile: No results for input(s): INR, PROTIME in the last 168 hours. Cardiac Enzymes: No results for input(s): CKTOTAL, CKMB, CKMBINDEX, TROPONINI in the last 168 hours. BNP (last 3 results) No results for input(s): PROBNP in the last 8760 hours. HbA1C: Recent Labs    05/24/20 0145  HGBA1C 7.7*   CBG: Recent Labs  Lab 05/23/20 2234 05/24/20 0616  GLUCAP 256* 188*   Lipid Profile: No results for input(s): CHOL, HDL, LDLCALC, TRIG, CHOLHDL, LDLDIRECT in the last 72 hours. Thyroid Function Tests: Recent Labs    05/23/20 2040  TSH 2.190   Anemia Panel: No results for input(s): VITAMINB12, FOLATE, FERRITIN, TIBC, IRON, RETICCTPCT in the last 72 hours. Sepsis Labs: No results for input(s): PROCALCITON, LATICACIDVEN in the last 168 hours.  Recent Results (from the past 240 hour(s))  Resp Panel by RT-PCR (Flu A&B, Covid) Nasopharyngeal Swab     Status: None   Collection Time: 05/23/20 10:37 AM   Specimen: Nasopharyngeal Swab; Nasopharyngeal(NP) swabs in vial transport medium  Result Value Ref Range Status   SARS Coronavirus 2 by RT PCR NEGATIVE NEGATIVE Final    Comment: (NOTE) SARS-CoV-2 target nucleic acids are NOT DETECTED.  The SARS-CoV-2 RNA is generally detectable in upper respiratory specimens during the acute phase of infection. The lowest concentration of SARS-CoV-2 viral copies this assay can detect is 138 copies/mL. A negative result does not preclude SARS-Cov-2 infection and should not be used as the sole basis for treatment or other patient management decisions. A negative result may occur with  improper specimen collection/handling, submission of specimen other than nasopharyngeal swab,  presence of viral mutation(s) within the areas targeted by this assay, and inadequate number of viral copies(<138 copies/mL). A negative result must be combined with clinical observations, patient history, and epidemiological information. The expected result is Negative.  Fact Sheet for Patients:  EntrepreneurPulse.com.au  Fact Sheet for Healthcare Providers:  IncredibleEmployment.be  This test is no t yet approved or cleared by the [place] FDA and  has been authorized for detection and/or diagnosis of SARS-CoV-2 by FDA under an Emergency Use Authorization (EUA). This EUA will remain  in effect (meaning this test can be used) for the duration of the COVID-19 declaration under Section 564(b)(1) of the Act, 21 U.S.C.section 360bbb-3(b)(1), unless the authorization is terminated  or revoked sooner.       Influenza A by PCR NEGATIVE NEGATIVE Final  Influenza B by PCR NEGATIVE NEGATIVE Final    Comment: (NOTE) The Xpert Xpress SARS-CoV-2/FLU/RSV plus assay is intended as an aid in the diagnosis of influenza from Nasopharyngeal swab specimens and should not be used as a sole basis for treatment. Nasal washings and aspirates are unacceptable for Xpert Xpress SARS-CoV-2/FLU/RSV testing.  Fact Sheet for Patients: EntrepreneurPulse.com.au  Fact Sheet for Healthcare Providers: IncredibleEmployment.be  This test is not yet approved or cleared by the [place] FDA and has been authorized for detection and/or diagnosis of SARS-CoV-2 by FDA under an Emergency Use Authorization (EUA). This EUA will remain in effect (meaning this test can be used) for the duration of the COVID-19 declaration under Section 564(b)(1) of the Act, 21 U.S.C. section 360bbb-3(b)(1), unless the authorization is terminated or revoked.  Performed at [facility], [address], [place], [place] [address]           Radiology Studies: DG Chest Portable 1 View  Result Date: 05/23/2020 CLINICAL DATA:  Shortness of breath.  Atrial fibrillation. EXAM: PORTABLE CHEST 1 VIEW COMPARISON:  01/27/2005 FINDINGS: Heart is enlarged. There is aortic atherosclerotic calcification. There is interstitial and early alveolar pulmonary edema. Possible small effusions. IMPRESSION: Congestive heart failure with interstitial and early alveolar pulmonary edema. Possible small effusions. Electronically Signed   By: Nelson Chimes M.D.   On: 05/23/2020 10:53   ECHOCARDIOGRAM COMPLETE  Result Date: 05/24/2020    ECHOCARDIOGRAM REPORT   Patient Name:   Austin Gomez Date of Exam: 05/24/2020 Medical Rec #:  [id number]        Height:       70.0 in Accession #:    [id number]       Weight:       215.9 lb Date of Birth:  Apr 01, 1945         BSA:          2.156 m Patient Age:    85 years         BP:           138/80 mmHg Patient Gender: M                HR:           75 bpm. Exam Location:  Inpatient Procedure: 2D Echo, Cardiac Doppler and Color Doppler Indications:    Atrial Fibrillation  History:        Patient has no prior history of Echocardiogram examinations.                 CAD, Arrythmias:Atrial Fibrillation; Risk Factors:Diabetes and                 Hypertension.  Sonographer:    Cammy Brochure Referring Phys: [id number] [place]  1. Left ventricular ejection fraction, by estimation, is 40 to 45%. The left ventricle has mildly decreased function. The left ventricle demonstrates global hypokinesis. There is moderate asymmetric left ventricular hypertrophy of the basal-septal segment. Left ventricular diastolic function could not be evaluated.  2. Right ventricular systolic function is mildly reduced. The right ventricular size is mildly enlarged. There is normal pulmonary artery systolic pressure. The estimated right ventricular systolic pressure is [id number] mmHg.  3. Left atrial size was mildly dilated.  4. Right  atrial size was mildly dilated.  5. A small pericardial effusion is present. The pericardial effusion is circumferential.  6. The mitral valve is grossly normal. Mild mitral valve regurgitation. No evidence of mitral stenosis.  7. The aortic valve is tricuspid. There is moderate calcification of the aortic valve. There is mild thickening of the aortic valve. Aortic valve regurgitation is not visualized. Mild aortic valve stenosis.  8. Aortic dilatation noted. There is mild dilatation of the aortic root, measuring 42 mm. There is mild dilatation of the ascending aorta, measuring 42 mm.  9. The inferior vena cava is normal in size with <50% respiratory variability, suggesting right atrial pressure of 8 mmHg. FINDINGS  Left Ventricle: Left ventricular ejection fraction, by estimation, is 40 to 45%. The left ventricle has mildly decreased function. The left ventricle demonstrates global hypokinesis. The left ventricular internal cavity size was normal in size. There is  moderate asymmetric left ventricular hypertrophy of the basal-septal segment. Left ventricular diastolic function could not be evaluated due to atrial fibrillation. Left ventricular diastolic function could not be evaluated. Right Ventricle: The right ventricular size is mildly enlarged. No increase in right ventricular wall thickness. Right ventricular systolic function is mildly reduced. There is normal pulmonary artery systolic pressure. The tricuspid regurgitant velocity  is 1.76 m/s, and with an assumed right atrial pressure of 8 mmHg, the estimated right ventricular systolic pressure is 74.2 mmHg. Left Atrium: Left atrial size was mildly dilated. Right Atrium: Right atrial size was mildly dilated. Pericardium: A small pericardial effusion is present. The pericardial effusion is circumferential. Mitral Valve: The mitral valve is grossly normal. Mild mitral valve regurgitation. No evidence of mitral valve stenosis. Tricuspid Valve: The tricuspid  valve is grossly normal. Tricuspid valve regurgitation is mild . No evidence of tricuspid stenosis. Aortic Valve: The aortic valve is tricuspid. There is moderate calcification of the aortic valve. There is mild thickening of the aortic valve. Aortic valve regurgitation is not visualized. Mild aortic stenosis is present. Aortic valve mean gradient measures 4.0 mmHg. Aortic valve peak gradient measures 6.1 mmHg. Aortic valve area, by VTI measures 2.51 cm. Pulmonic Valve: The pulmonic valve was grossly normal. Pulmonic valve regurgitation is not visualized. No evidence of pulmonic stenosis. Aorta: Aortic dilatation noted. There is mild dilatation of the aortic root, measuring 42 mm. There is mild dilatation of the ascending aorta, measuring 42 mm. Venous: The inferior vena cava is normal in size with less than 50% respiratory variability, suggesting right atrial pressure of 8 mmHg. IAS/Shunts: The atrial septum is grossly normal.  LEFT VENTRICLE PLAX 2D LVIDd:         5.00 cm LVIDs:         3.90 cm LV PW:         1.60 cm LV IVS:        1.70 cm LVOT diam:     2.30 cm LV SV:         50 LV SV Index:   23 LVOT Area:     4.15 cm  LV Volumes (MOD) LV vol d, MOD A2C: 96.8 ml LV vol d, MOD A4C: 98.8 ml LV vol s, MOD A2C: 59.5 ml LV vol s, MOD A4C: 57.9 ml LV SV MOD A2C:     37.3 ml LV SV MOD A4C:     98.8 ml LV SV MOD BP:      39.8 ml RIGHT VENTRICLE            IVC RV S prime:     5.81 cm/s  IVC diam: 1.60 cm LEFT ATRIUM             Index       RIGHT ATRIUM  Index LA diam:        4.40 cm 2.04 cm/m  RA Area:     24.10 cm LA Vol (A2C):   73.1 ml 33.90 ml/m RA Volume:   70.70 ml  32.79 ml/m LA Vol (A4C):   67.1 ml 31.12 ml/m LA Biplane Vol: 76.5 ml 35.48 ml/m  AORTIC VALVE AV Area (Vmax):    2.50 cm AV Area (Vmean):   2.30 cm AV Area (VTI):     2.51 cm AV Vmax:           123.67 cm/s AV Vmean:          94.133 cm/s AV VTI:            0.200 m AV Peak Grad:      6.1 mmHg AV Mean Grad:      4.0 mmHg LVOT Vmax:          74.27 cm/s LVOT Vmean:        52.167 cm/s LVOT VTI:          0.121 m LVOT/AV VTI ratio: 0.60  AORTA Ao Root diam: 4.20 cm Ao Asc diam:  4.25 cm TRICUSPID VALVE TR Peak grad:   12.4 mmHg TR Vmax:        176.00 cm/s  SHUNTS Systemic VTI:  0.12 m Systemic Diam: 2.30 cm Eleonore Chiquito MD Electronically signed by Eleonore Chiquito MD Signature Date/Time: 05/24/2020/11:23:32 AM    Final         Scheduled Meds: . apixaban  5 mg Oral BID  . atorvastatin  40 mg Oral QPM  . clopidogrel  75 mg Oral Daily  . DULoxetine  60 mg Oral QPM  . gabapentin  600 mg Oral TID  . insulin aspart  0-15 Units Subcutaneous TID AC & HS  . insulin glargine  10 Units Subcutaneous QHS  . metoprolol tartrate  50 mg Oral BID  . multivitamin with minerals  1 tablet Oral Daily   Continuous Infusions: . sodium chloride 500 mL (05/23/20 1113)     LOS: 1 day    Time spent: 39 minutes spent on chart review, discussion with nursing staff, consultants, updating family and interview/physical exam; more than 50% of that time was spent in counseling and/or coordination of care.    Azeneth Carbonell J [place] DO [facility] Available via Epic secure chat 7am-7pm After these hours, please refer to coverage provider listed on [email or web address] 05/24/2020, 11:50 AM

## 2020-05-24 NOTE — Progress Notes (Signed)
Patient ambulating in hallway independently with spouse. Will monitor patient. Austin Gomez, Bettina Gavia RN

## 2020-05-24 NOTE — Progress Notes (Signed)
Mobility Specialist: Progress Note   05/24/20 1505  Mobility  Activity Ambulated in hall  Level of Assistance Contact guard assist, steadying assist  Assistive Device None  Distance Ambulated (ft) 560 ft  Mobility Response Tolerated well  Mobility performed by Mobility specialist  $Mobility charge 1 Mobility   Pre-Mobility: 77 HR, 97% SpO2 During Mobility: 123 HR Post-Mobility: 102 HR, 99% SpO2  Pt c/o feeling a little SOB during ambulation, otherwise asx. Pt required contact guard during ambulation as he was slightly unsteady due to fast pace. Pt sitting EOB after walk with family present in room.   [facility] Theodore Virgin Mobility Specialist Mobility Specialist Phone: [phone]

## 2020-05-24 NOTE — Progress Notes (Incomplete)
  Echocardiogram 2D Echocardiogram has been performed.  Austin Gomez  Mendel Owens Shark 05/24/2020, 9:22 AM

## 2020-05-25 LAB — BASIC METABOLIC PANEL
Anion gap: 10 (ref 5–15)
BUN: 19 mg/dL (ref 8–23)
CO2: 24 mmol/L (ref 22–32)
Calcium: 9.5 mg/dL (ref 8.9–10.3)
Chloride: 101 mmol/L (ref 98–111)
Creatinine, Ser: 0.88 mg/dL (ref 0.61–1.24)
GFR, Estimated: >60 mL/min (ref >60)
Glucose, Bld: 158 mg/dL — ABNORMAL HIGH (ref 70–99)
Potassium: 3.8 mmol/L (ref 3.5–5.1)
Sodium: 135 mmol/L (ref 135–145)

## 2020-05-25 LAB — GLUCOSE, CAPILLARY
Glucose-Capillary: 215 mg/dL — ABNORMAL HIGH (ref 70–99)
Glucose-Capillary: 179 mg/dL — ABNORMAL HIGH (ref 70–99)

## 2020-05-25 LAB — MAGNESIUM: Magnesium: 1.9 mg/dL (ref 1.7–2.4)

## 2020-05-25 MED ORDER — ATORVASTATIN CALCIUM 40 MG PO TABS: 40.0000 mg | ORAL_TABLET | Freq: Every evening | ORAL | 0 refills | Status: AC

## 2020-05-25 MED ORDER — ELIQUIS 5 MG PO TABS: 1.0000 | ORAL_TABLET | Freq: Two times a day (BID) | ORAL | 0 refills | Status: AC

## 2020-05-25 MED ORDER — GLIPIZIDE 10 MG PO TABS: 20.0000 mg | ORAL_TABLET | Freq: Two times a day (BID) | ORAL | 2 refills | Status: AC

## 2020-05-25 MED ORDER — CLOPIDOGREL BISULFATE 75 MG PO TABS: 75.0000 mg | ORAL_TABLET | Freq: Every day | ORAL | 0 refills | Status: AC

## 2020-05-25 MED ORDER — METFORMIN HCL 1000 MG PO TABS: 1000.0000 mg | ORAL_TABLET | Freq: Two times a day (BID) | ORAL | 0 refills | Status: AC

## 2020-05-25 MED ORDER — LOSARTAN POTASSIUM 25 MG PO TABS: 25.0000 mg | ORAL_TABLET | Freq: Every day | ORAL | 0 refills | Status: DC

## 2020-05-25 MED ORDER — FUROSEMIDE 20 MG PO TABS: 20.0000 mg | ORAL_TABLET | Freq: Every day | ORAL | 0 refills | Status: DC | PRN

## 2020-05-25 MED ORDER — BLOOD GLUCOSE METER KIT: PACK | 0 refills | Status: AC

## 2020-05-25 MED ORDER — METOPROLOL TARTRATE 50 MG PO TABS: 50.0000 mg | ORAL_TABLET | Freq: Two times a day (BID) | ORAL | 0 refills | Status: DC

## 2020-05-25 NOTE — Discharge Summary (Addendum)
Physician Discharge Summary  Austin Gomez KJZ:[id number] DOB: 01-21-1946 DOA: 05/23/2020  PCP: Maggie Schwalbe, PA-C  Admit date: 05/23/2020 Discharge date: 05/25/2020  Admitted From: Home Disposition: Home  Recommendations for Outpatient Follow-up:  1. Follow up with PCP in 1-2 weeks 2. Ambulatory referral placed to cardiology in A. fib clinic 3. Restarted Plavix with recent stent placement in [place] 4. Restarted metoprolol 50 mg p.o. twice daily and Eliquis for A. fib with RVR 5. Started on losartan 25 mg p.o. daily for mildly reduced EF 6. Please obtain BMP/CBC in one week 7. Please follow up on daily weight log at next PCP/cardiology visit  Home Health: No Equipment/Devices: None  Discharge Condition: Stable CODE STATUS: Full code Diet recommendation: Heart healthy diet  History of present illness:  Austin Gomez is a 75 year old male with past medical history significant for paroxysmal atrial fibrillation, chronic diastolic congestive heart failure, non-insulin-dependent diabetes mellitus, HLD, essential hypertension, CAD s/p PCI 2006 and 02/2020 in [place] who presented to the emergency department with complaints of shortness of breath.  Over the past several weeks, patient has been noting bruising to bilateral upper extremities and approximately 5 days ago he stopped taking his metoprolol, Eliquis and aspirin because of this.  Also he reports has stopped taking his Plavix even with recent PCI/stent placement in February 2022.  Shortness of breath is worse with exertion and improved with rest.  Also associated with increased bilateral lower extremity edema.  Patient denies any sick contacts, recent travel.  Patient also was prescribed Lasix to use as needed, used 2 doses initially and has since discarded as "medication not effective".  In the ED, temperature 97.7 F, HR 144, RR 27, BP 162/115, SPO2 96% on room air.  Sodium 137, potassium 3.8, chloride 102, CO2 23, glucose  287, BUN 13, creatinine 0.65.  High-sensitivity troponin 30>32.  BNP 170.2.  WBC 7.4, hemoglobin 13.2, platelets 180.  TSH 2.190, D-dimer 0.64.  COVID-19 PCR negative.  Influenza A/B PCR negative.  Chest x-ray with interstitial and early alveolar pulmonary edema.  Hospitalist service consulted for further evaluation and management.  Hospital course:  Paroxysmal atrial fibrillation with RVR Patient presenting to the hospital with progressive shortness of breath.  Recently stopped taking his metoprolol and Eliquis.  On arrival, patient was noted to have an elevated heart rate of 144.  Initially started on a Cardizem drip and his home metoprolol was restarted, with ability to titrate off of Cardizem drip. -- Continue metoprolol tartrate 50 mg p.o. twice daily -- Eliquis 5 mg p.o. twice daily -- Ambulatory referral placed to establish care with cardiology in [place] per patient/daughter request -- Continue to monitor on telemetry  Acute on chronic combined systolic/diastolic congestive heart failure Patient presenting with progressive shortness of breath.  Elevated BNP and chest x-ray findings consistent with pulmonary edema.  Received 1 dose of furosemide 40 mg IV in the ED.  Patient reports has discontinued his home furosemide for unclear reasons.  TTE with LVEF 40-45%, LV mildly decreased function, LV global hypokinesis with moderate asymmetric left ventricular hypertrophy basal septal segment, RV systolic function mildly reduced, RV mildly enlarged, LA/RA mildly dilated, small pericardial effusion circumferential, mild AV stenosis, mild MR, mild dilation aortic root 42 mm, IVC normal in size.  Restarted metoprolol tartrate 50 mg p.o. twice daily, losartan 25 mg p.o. daily.  On statin.  Furosemide 20 mg p.o. daily as needed for weight gain.  Ambulatory referral placed for cardiology and A. fib clinic.  Essential  hypertension Metoprolol tartrate 50 mg p.o. twice daily, losartan 25 mg p.o.  daily  Type 2 diabetes mellitus with hyperglycemia On metformin 1000 mg p.o. twice daily, glimepiride and Ozempic outpatient.  Hemoglobin A1c 7.7.  Outpatient follow-up with PCP.  CAD s/p PCI/stent 02/2020 Patient with initial PCI/stent 2006 followed [facility] February 2022.  Patient was prescribed Plavix in which he discontinued due to bruising.  Resume Plavix.  Outpatient follow-up with cardiology.  Elevated troponin Patient presented with progressive shortness of breath likely secondary to decompensated CHF exacerbation and A. fib with RVR as above.  High sensitive troponin flat at 30>32.  Etiology likely secondary to type II demand ischemia.  Currently chest pain-free.  Monitor on telemetry during hospitalization with no dynamic changes or arrhythmias noted.  Discharge Diagnoses:  Active Problems:   Acute on chronic diastolic CHF (congestive heart failure) (HCC)   Essential hypertension   Mixed hyperlipidemia   Type 2 diabetes mellitus with hyperglycemia, without long-term current use of insulin (HCC)   Coronary artery disease involving native coronary artery of native heart   Elevated troponin level not due myocardial infarction    Discharge Instructions  Discharge Instructions    (HEART FAILURE PATIENTS) Call MD:  Anytime you have any of the following symptoms: 1) 3 pound weight gain in 24 hours or 5 pounds in 1 week 2) shortness of breath, with or without a dry hacking cough 3) swelling in the hands, feet or stomach 4) if you have to sleep on extra pillows at night in order to breathe.   Complete by: As directed    Amb referral to AFIB Clinic   Complete by: As directed    Ambulatory referral to Cardiology   Complete by: As directed    Afib, CAD s/p PCI/stent. Needs to establish care   Call MD for:  difficulty breathing, headache or visual disturbances   Complete by: As directed    Call MD for:  extreme fatigue   Complete by: As  directed    Call MD for:  persistant dizziness or light-headedness   Complete by: As directed    Call MD for:  persistant nausea and vomiting   Complete by: As directed    Call MD for:  severe uncontrolled pain   Complete by: As directed    Call MD for:  temperature >100.4   Complete by: As directed    Diet - low sodium heart healthy   Complete by: As directed    Increase activity slowly   Complete by: As directed      Allergies as of 05/25/2020   No Known Allergies     Medication List    STOP taking these medications   amLODipine 5 MG tablet Commonly known as: NORVASC   aspirin 81 MG chewable tablet     TAKE these medications   atorvastatin 40 MG tablet Commonly known as: LIPITOR Take 1 tablet (40 mg total) by mouth every evening.   blood glucose meter kit and supplies Dispense based on patient and insurance preference. Use up to four times daily as directed. (FOR ICD-10 E10.9, E11.9).   clopidogrel 75 MG tablet Commonly known as: PLAVIX Take 1 tablet (75 mg total) by mouth daily. Start taking on: May 26, 2020   DULoxetine 60 MG capsule Commonly known as: CYMBALTA Take 60 mg by mouth every evening.   Eliquis 5 MG Tabs tablet Generic drug: apixaban Take 1 tablet (5 mg total) by  mouth 2 (two) times daily. What changed: how much to take   furosemide 20 MG tablet Commonly known as: LASIX Take 1 tablet (20 mg total) by mouth daily as needed for fluid or edema (take 51m for weight gain >3lbs in one day or 5 lbs in one week). What changed: reasons to take this   gabapentin 600 MG tablet Commonly known as: NEURONTIN Take 600 mg by mouth 3 (three) times daily.   glipiZIDE 10 MG tablet Commonly known as: GLUCOTROL Take 2 tablets (20 mg total) by mouth 2 (two) times daily before a meal.   losartan 25 MG tablet Commonly known as: COZAAR Take 1 tablet (25 mg total) by mouth daily. Start taking on: May 26, 2020   metFORMIN 1000 MG tablet Commonly known as:  GLUCOPHAGE Take 1 tablet (1,000 mg total) by mouth 2 (two) times daily with a meal.   metoprolol tartrate 50 MG tablet Commonly known as: LOPRESSOR Take 1 tablet (50 mg total) by mouth 2 (two) times daily.   multivitamin with minerals tablet Take 1 tablet by mouth daily.   Ozempic (1 MG/DOSE) 4 MG/3ML Sopn Generic drug: Semaglutide (1 MG/DOSE) Inject 1 mg into the skin once a week.       Follow-up Information    Nodal, JAlphonzo Dublin PA-C. Schedule an appointment as soon as possible for a visit in 1 week(s).   Specialty: Physician Assistant Contact information: [address]RM[address] [phone]       MRichardo Priest MD. Schedule an appointment as soon as possible for a visit in 2 week(s).   Specialties: Cardiology, Radiology Contact information: [phone]             No Known Allergies  Consultations: None  Procedures/Studies: DG Chest Portable 1 View  Result Date: 05/23/2020 CLINICAL DATA:  Shortness of breath.  Atrial fibrillation. EXAM: PORTABLE CHEST 1 VIEW COMPARISON:  01/27/2005 FINDINGS: Heart is enlarged. There is aortic atherosclerotic calcification. There is interstitial and early alveolar pulmonary edema. Possible small effusions. IMPRESSION: Congestive heart failure with interstitial and early alveolar pulmonary edema. Possible small effusions. Electronically Signed   By: MNelson ChimesM.D.   On: 05/23/2020 10:53   ECHOCARDIOGRAM COMPLETE  Result Date: 05/24/2020    ECHOCARDIOGRAM REPORT   Patient Name:   AGORGE ALMANZADate of Exam: 05/24/2020 Medical Rec #:  [id number]       Height:       70.0 in Accession #:    [id number]      Weight:       215.9 lb Date of Birth:  7July 23, 1947        BSA:          2.156 m Patient Age:    89years         BP:           138/80 mmHg Patient Gender: M                HR:           75 bpm. Exam Location:  Inpatient Procedure: 2D Echo, Cardiac Doppler and  Color Doppler Indications:    Atrial Fibrillation  History:        Patient has no prior history of Echocardiogram examinations.                 CAD, Arrythmias:Atrial Fibrillation; Risk Factors:Diabetes and  Hypertension.  Sonographer:    Cammy Brochure Referring Phys: [id number] [place]  1. Left ventricular ejection fraction, by estimation, is 40 to 45%. The left ventricle has mildly decreased function. The left ventricle demonstrates global hypokinesis. There is moderate asymmetric left ventricular hypertrophy of the basal-septal segment. Left ventricular diastolic function could not be evaluated.  2. Right ventricular systolic function is mildly reduced. The right ventricular size is mildly enlarged. There is normal pulmonary artery systolic pressure. The estimated right ventricular systolic pressure is 33.0 mmHg.  3. Left atrial size was mildly dilated.  4. Right atrial size was mildly dilated.  5. A small pericardial effusion is present. The pericardial effusion is circumferential.  6. The mitral valve is grossly normal. Mild mitral valve regurgitation. No evidence of mitral stenosis.  7. The aortic valve is tricuspid. There is moderate calcification of the aortic valve. There is mild thickening of the aortic valve. Aortic valve regurgitation is not visualized. Mild aortic valve stenosis.  8. Aortic dilatation noted. There is mild dilatation of the aortic root, measuring 42 mm. There is mild dilatation of the ascending aorta, measuring 42 mm.  9. The inferior vena cava is normal in size with <50% respiratory variability, suggesting right atrial pressure of 8 mmHg. FINDINGS  Left Ventricle: Left ventricular ejection fraction, by estimation, is 40 to 45%. The left ventricle has mildly decreased function. The left ventricle demonstrates global hypokinesis. The left ventricular internal cavity size was normal in size. There is  moderate asymmetric left ventricular hypertrophy of  the basal-septal segment. Left ventricular diastolic function could not be evaluated due to atrial fibrillation. Left ventricular diastolic function could not be evaluated. Right Ventricle: The right ventricular size is mildly enlarged. No increase in right ventricular wall thickness. Right ventricular systolic function is mildly reduced. There is normal pulmonary artery systolic pressure. The tricuspid regurgitant velocity  is 1.76 m/s, and with an assumed right atrial pressure of 8 mmHg, the estimated right ventricular systolic pressure is 07.6 mmHg. Left Atrium: Left atrial size was mildly dilated. Right Atrium: Right atrial size was mildly dilated. Pericardium: A small pericardial effusion is present. The pericardial effusion is circumferential. Mitral Valve: The mitral valve is grossly normal. Mild mitral valve regurgitation. No evidence of mitral valve stenosis. Tricuspid Valve: The tricuspid valve is grossly normal. Tricuspid valve regurgitation is mild . No evidence of tricuspid stenosis. Aortic Valve: The aortic valve is tricuspid. There is moderate calcification of the aortic valve. There is mild thickening of the aortic valve. Aortic valve regurgitation is not visualized. Mild aortic stenosis is present. Aortic valve mean gradient measures 4.0 mmHg. Aortic valve peak gradient measures 6.1 mmHg. Aortic valve area, by VTI measures 2.51 cm. Pulmonic Valve: The pulmonic valve was grossly normal. Pulmonic valve regurgitation is not visualized. No evidence of pulmonic stenosis. Aorta: Aortic dilatation noted. There is mild dilatation of the aortic root, measuring 42 mm. There is mild dilatation of the ascending aorta, measuring 42 mm. Venous: The inferior vena cava is normal in size with less than 50% respiratory variability, suggesting right atrial pressure of 8 mmHg. IAS/Shunts: The atrial septum is grossly normal.  LEFT VENTRICLE PLAX 2D LVIDd:         5.00 cm LVIDs:         3.90 cm LV PW:         1.60 cm  LV IVS:        1.70 cm LVOT diam:     2.30 cm  LV SV:         50 LV SV Index:   23 LVOT Area:     4.15 cm  LV Volumes (MOD) LV vol d, MOD A2C: 96.8 ml LV vol d, MOD A4C: 98.8 ml LV vol s, MOD A2C: 59.5 ml LV vol s, MOD A4C: 57.9 ml LV SV MOD A2C:     37.3 ml LV SV MOD A4C:     98.8 ml LV SV MOD BP:      39.8 ml RIGHT VENTRICLE            IVC RV S prime:     5.81 cm/s  IVC diam: 1.60 cm LEFT ATRIUM             Index       RIGHT ATRIUM           Index LA diam:        4.40 cm 2.04 cm/m  RA Area:     24.10 cm LA Vol (A2C):   73.1 ml 33.90 ml/m RA Volume:   70.70 ml  32.79 ml/m LA Vol (A4C):   67.1 ml 31.12 ml/m LA Biplane Vol: 76.5 ml 35.48 ml/m  AORTIC VALVE AV Area (Vmax):    2.50 cm AV Area (Vmean):   2.30 cm AV Area (VTI):     2.51 cm AV Vmax:           123.67 cm/s AV Vmean:          94.133 cm/s AV VTI:            0.200 m AV Peak Grad:      6.1 mmHg AV Mean Grad:      4.0 mmHg LVOT Vmax:         74.27 cm/s LVOT Vmean:        52.167 cm/s LVOT VTI:          0.121 m LVOT/AV VTI ratio: 0.60  AORTA Ao Root diam: 4.20 cm Ao Asc diam:  4.25 cm TRICUSPID VALVE TR Peak grad:   12.4 mmHg TR Vmax:        176.00 cm/s  SHUNTS Systemic VTI:  0.12 m Systemic Diam: 2.30 cm Eleonore Chiquito MD Electronically signed by Eleonore Chiquito MD Signature Date/Time: 05/24/2020/11:23:32 AM    Final      Subjective: Patient seen and examined at bedside, resting comfortably.  Spouse present.  Aided with Spanish video interpreter [place] [phone].  Patient with no complaints this morning, sitting edge of bed.  Ready for discharge home.  Continues in A. fib, now rate controlled once restarted his home metoprolol.  Discussed with him extensively at bedside with spouse present that he needs to comply with his medication regimen to avoid any further exacerbations of his atrial fibrillation.  Also discussed need for continued Eliquis for prevention of thrombotic events as well as Plavix with recent stent placement.  Outpatient referral placed to  cardiology.  No other questions or concerns at this time.  Denies headache, no fever/chills/night sweats, no nausea/vomiting/diarrhea, no chest pain, no palpitations, no shortness of breath, no abdominal pain, no weakness, no fatigue, no paresthesias.  No acute events overnight per nursing staff.  Discharge Exam: Vitals:   05/25/20 1010 05/25/20 1147  BP: 114/77 114/73  Pulse: 88 77  Resp:  (!) 24  Temp:  98 F (36.7 C)  SpO2:  98%   Vitals:   05/25/20 0452 05/25/20 0827 05/25/20 1010 05/25/20 1147  BP:  113/76 114/77  114/73  Pulse: 89 92 88 77  Resp: 19 (!) 22  (!) 24  Temp:  97.8 F (36.6 C)  98 F (36.7 C)  TempSrc:  Oral  Oral  SpO2: 95% 96%  98%  Weight:      Height:        General: Pt is alert, awake, not in acute distress Cardiovascular: Irregularly irregular rhythm, normal rate, S1/S2 +, no rubs, no gallops Respiratory: CTA bilaterally, no wheezing, no rhonchi, on room air Abdominal: Soft, NT, ND, bowel sounds + Extremities: no edema, no cyanosis    The results of significant diagnostics from this hospitalization (including imaging, microbiology, ancillary and laboratory) are listed below for reference.     Microbiology: Recent Results (from the past 240 hour(s))  Resp Panel by RT-PCR (Flu A&B, Covid) Nasopharyngeal Swab     Status: None   Collection Time: 05/23/20 10:37 AM   Specimen: Nasopharyngeal Swab; Nasopharyngeal(NP) swabs in vial transport medium  Result Value Ref Range Status   SARS Coronavirus 2 by RT PCR NEGATIVE NEGATIVE Final    Comment: (NOTE) SARS-CoV-2 target nucleic acids are NOT DETECTED.  The SARS-CoV-2 RNA is generally detectable in upper respiratory specimens during the acute phase of infection. The lowest concentration of SARS-CoV-2 viral copies this assay can detect is 138 copies/mL. A negative result does not preclude SARS-Cov-2 infection and should not be used as the sole basis for treatment or other patient management decisions. A  negative result may occur with  improper specimen collection/handling, submission of specimen other than nasopharyngeal swab, presence of viral mutation(s) within the areas targeted by this assay, and inadequate number of viral copies(<138 copies/mL). A negative result must be combined with clinical observations, patient history, and epidemiological information. The expected result is Negative.  Fact Sheet for Patients:  EntrepreneurPulse.com.au  Fact Sheet for Healthcare Providers:  IncredibleEmployment.be  This test is no t yet approved or cleared by the [place] FDA and  has been authorized for detection and/or diagnosis of SARS-CoV-2 by FDA under an Emergency Use Authorization (EUA). This EUA will remain  in effect (meaning this test can be used) for the duration of the COVID-19 declaration under Section 564(b)(1) of the Act, 21 U.S.C.section 360bbb-3(b)(1), unless the authorization is terminated  or revoked sooner.       Influenza A by PCR NEGATIVE NEGATIVE Final   Influenza B by PCR NEGATIVE NEGATIVE Final    Comment: (NOTE) The Xpert Xpress SARS-CoV-2/FLU/RSV plus assay is intended as an aid in the diagnosis of influenza from Nasopharyngeal swab specimens and should not be used as a sole basis for treatment. Nasal washings and aspirates are unacceptable for Xpert Xpress SARS-CoV-2/FLU/RSV testing.  Fact Sheet for Patients: EntrepreneurPulse.com.au  Fact Sheet for Healthcare Providers: IncredibleEmployment.be  This test is not yet approved or cleared by the [place] FDA and has been authorized for detection and/or diagnosis of SARS-CoV-2 by FDA under an Emergency Use Authorization (EUA). This EUA will remain in effect (meaning this test can be used) for the duration of the COVID-19 declaration under Section 564(b)(1) of the Act, 21 U.S.C. section 360bbb-3(b)(1), unless the authorization  is terminated or revoked.  Performed at [facility], [address] [place], [place] [address]      Labs: BNP (last 3 results) Recent Labs    05/23/20 1018  BNP 341.9*   Basic Metabolic Panel: Recent Labs  Lab 05/23/20 1018 05/23/20 2040 05/25/20 0159  NA 137  --  135  K 3.8  --  3.8  CL 102  --  101  CO2 23  --  24  GLUCOSE 287*  --  158*  BUN 13  --  19  CREATININE 0.65  --  0.88  CALCIUM 9.6  --  9.5  MG  --  1.7 1.9   Liver Function Tests: Recent Labs  Lab 05/23/20 1018  AST 24  ALT 17  ALKPHOS 50  BILITOT 1.2  PROT 7.1  ALBUMIN 4.1   No results for input(s): LIPASE, AMYLASE in the last 168 hours. No results for input(s): AMMONIA in the last 168 hours. CBC: Recent Labs  Lab 05/23/20 1018  WBC 7.4  NEUTROABS 5.1  HGB 13.2  HCT 40.8  MCV 92.9  PLT 180   Cardiac Enzymes: No results for input(s): CKTOTAL, CKMB, CKMBINDEX, TROPONINI in the last 168 hours. BNP: Invalid input(s): POCBNP CBG: Recent Labs  Lab 05/24/20 1208 05/24/20 1620 05/24/20 2131 05/25/20 0612 05/25/20 1145  GLUCAP 200* 146* 206* 215* 179*   D-Dimer Recent Labs    05/23/20 2040  DDIMER 0.64*   Hgb A1c Recent Labs    05/24/20 0145  HGBA1C 7.7*   Lipid Profile No results for input(s): CHOL, HDL, LDLCALC, TRIG, CHOLHDL, LDLDIRECT in the last 72 hours. Thyroid function studies Recent Labs    05/23/20 2040  TSH 2.190   Anemia work up No results for input(s): VITAMINB12, FOLATE, FERRITIN, TIBC, IRON, RETICCTPCT in the last 72 hours. Urinalysis No results found for: COLORURINE, APPEARANCEUR, [place], [place], GLUCOSEU, [place], [place], [place], PROTEINUR, UROBILINOGEN, NITRITE, LEUKOCYTESUR Sepsis Labs Invalid input(s): PROCALCITONIN,  WBC,  LACTICIDVEN Microbiology Recent Results (from the past 240 hour(s))  Resp Panel by RT-PCR (Flu A&B, Covid) Nasopharyngeal Swab     Status: None   Collection Time: 05/23/20 10:37 AM   Specimen:  Nasopharyngeal Swab; Nasopharyngeal(NP) swabs in vial transport medium  Result Value Ref Range Status   SARS Coronavirus 2 by RT PCR NEGATIVE NEGATIVE Final    Comment: (NOTE) SARS-CoV-2 target nucleic acids are NOT DETECTED.  The SARS-CoV-2 RNA is generally detectable in upper respiratory specimens during the acute phase of infection. The lowest concentration of SARS-CoV-2 viral copies this assay can detect is 138 copies/mL. A negative result does not preclude SARS-Cov-2 infection and should not be used as the sole basis for treatment or other patient management decisions. A negative result may occur with  improper specimen collection/handling, submission of specimen other than nasopharyngeal swab, presence of viral mutation(s) within the areas targeted by this assay, and inadequate number of viral copies(<138 copies/mL). A negative result must be combined with clinical observations, patient history, and epidemiological information. The expected result is Negative.  Fact Sheet for Patients:  EntrepreneurPulse.com.au  Fact Sheet for Healthcare Providers:  IncredibleEmployment.be  This test is no t yet approved or cleared by the [place] FDA and  has been authorized for detection and/or diagnosis of SARS-CoV-2 by FDA under an Emergency Use Authorization (EUA). This EUA will remain  in effect (meaning this test can be used) for the duration of the COVID-19 declaration under Section 564(b)(1) of the Act, 21 U.S.C.section 360bbb-3(b)(1), unless the authorization is terminated  or revoked sooner.       Influenza A by PCR NEGATIVE NEGATIVE Final   Influenza B by PCR NEGATIVE NEGATIVE Final    Comment: (NOTE) The Xpert Xpress SARS-CoV-2/FLU/RSV plus assay is intended as an aid in the diagnosis of influenza from Nasopharyngeal swab specimens and should not be used  as a sole basis for treatment. Nasal washings and aspirates are unacceptable for  Xpert Xpress SARS-CoV-2/FLU/RSV testing.  Fact Sheet for Patients: EntrepreneurPulse.com.au  Fact Sheet for Healthcare Providers: IncredibleEmployment.be  This test is not yet approved or cleared by the [place] FDA and has been authorized for detection and/or diagnosis of SARS-CoV-2 by FDA under an Emergency Use Authorization (EUA). This EUA will remain in effect (meaning this test can be used) for the duration of the COVID-19 declaration under Section 564(b)(1) of the Act, 21 U.S.C. section 360bbb-3(b)(1), unless the authorization is terminated or revoked.  Performed at [facility], [address] [place], [place] [address]      Time coordinating discharge: Over 30 minutes  SIGNED:   Donnamarie Poag [place] DO  [facility] 05/25/2020, 1:19 PM

## 2020-05-25 NOTE — Plan of Care (Signed)

## 2020-05-25 NOTE — Progress Notes (Signed)
Mobility Specialist: Progress Note   05/25/20 1120  Mobility  Activity Ambulated in hall  Level of Assistance Independent  Assistive Device None  Distance Ambulated (ft) 560 ft  Mobility Response Tolerated well  Mobility performed by Mobility specialist  $Mobility charge 1 Mobility   Pre-Mobility: 87 HR Post-Mobility: 103-140 HR, 136/86 BP, 98% SpO2  Pt asx during ambulation. Pt's HR 120 bpm after returning to room and peaked at 140 bpm. After resting 1 minute pt's HR returned to 103. Pt sitting EOB and is hopeful for discharge later today.   [facility] Gomez Mobility Specialist Mobility Specialist Phone: [phone]

## 2020-05-25 NOTE — Plan of Care (Signed)
  Problem: Education: Goal: Knowledge of General Education information will improve Description: Including pain rating scale, medication(s)/side effects and non-pharmacologic comfort measures 05/25/2020 1221 by Raelyn Number, RN Outcome: Adequate for Discharge 05/25/2020 1219 by Raelyn Number, RN Outcome: Progressing   Problem: Health Behavior/Discharge Planning: Goal: Ability to manage health-related needs will improve 05/25/2020 1221 by Raelyn Number, RN Outcome: Adequate for Discharge 05/25/2020 1219 by Raelyn Number, RN Outcome: Progressing   Problem: Clinical Measurements: Goal: Ability to maintain clinical measurements within normal limits will improve 05/25/2020 1221 by Raelyn Number, RN Outcome: Adequate for Discharge 05/25/2020 1219 by Raelyn Number, RN Outcome: Progressing Goal: Will remain free from infection 05/25/2020 1221 by Raelyn Number, RN Outcome: Adequate for Discharge 05/25/2020 1219 by Raelyn Number, RN Outcome: Progressing Goal: Diagnostic test results will improve 05/25/2020 1221 by Raelyn Number, RN Outcome: Adequate for Discharge 05/25/2020 1219 by Raelyn Number, RN Outcome: Progressing Goal: Respiratory complications will improve 05/25/2020 1221 by Raelyn Number, RN Outcome: Adequate for Discharge 05/25/2020 1220 by Raelyn Number, RN Outcome: Not Progressing 05/25/2020 1219 by Raelyn Number, RN Outcome: Progressing Goal: Cardiovascular complication will be avoided 05/25/2020 1221 by Raelyn Number, RN Outcome: Adequate for Discharge 05/25/2020 1219 by Raelyn Number, RN Outcome: Progressing   Problem: Activity: Goal: Risk for activity intolerance will decrease 05/25/2020 1221 by Raelyn Number, RN Outcome: Adequate for Discharge 05/25/2020 1219 by Raelyn Number, RN Outcome: Progressing   Problem: Nutrition: Goal: Adequate nutrition will be maintained 05/25/2020 1221 by Raelyn Number, RN Outcome: Adequate  for Discharge 05/25/2020 1219 by Raelyn Number, RN Outcome: Progressing   Problem: Coping: Goal: Level of anxiety will decrease 05/25/2020 1221 by Raelyn Number, RN Outcome: Adequate for Discharge 05/25/2020 1219 by Raelyn Number, RN Outcome: Progressing   Problem: Elimination: Goal: Will not experience complications related to bowel motility 05/25/2020 1221 by Raelyn Number, RN Outcome: Adequate for Discharge 05/25/2020 1220 by Raelyn Number, RN Outcome: Not Progressing 05/25/2020 1219 by Raelyn Number, RN Outcome: Progressing Goal: Will not experience complications related to urinary retention 05/25/2020 1221 by Raelyn Number, RN Outcome: Adequate for Discharge 05/25/2020 1220 by Raelyn Number, RN Outcome: Not Progressing 05/25/2020 1219 by Raelyn Number, RN Outcome: Progressing   Problem: Pain Managment: Goal: General experience of comfort will improve 05/25/2020 1221 by Raelyn Number, RN Outcome: Adequate for Discharge 05/25/2020 1219 by Raelyn Number, RN Outcome: Progressing   Problem: Safety: Goal: Ability to remain free from injury will improve 05/25/2020 1221 by Raelyn Number, RN Outcome: Adequate for Discharge 05/25/2020 1219 by Raelyn Number, RN Outcome: Progressing   Problem: Skin Integrity: Goal: Risk for impaired skin integrity will decrease 05/25/2020 1221 by Raelyn Number, RN Outcome: Adequate for Discharge 05/25/2020 1219 by Raelyn Number, RN Outcome: Progressing

## 2020-05-25 NOTE — Progress Notes (Signed)
Pt discharged home with wife. AVS provided in Spanish, per pt request. All questions answered. IVs removed. Telemetry box removed.

## 2020-05-25 NOTE — Discharge Instructions (Addendum)
Fibrilacin auricular Atrial Fibrillation  La fibrilacin auricular es un tipo de latido cardaco irregular o rpido (arritmia). En la fibrilacin auricular, la parte superior del corazn (aurculas) late con un patrn irregular. Esto hace que el corazn no pueda bombear sangre de Hiram normal y Armed forces logistics/support/administrative officer. El [place] del tratamiento es prevenir la formacin de cogulos de [place], Chief Technology Officer la frecuencia cardaca o Medical laboratory scientific officer el ritmo normal de los latidos cardacos. Si esta afeccin no se trata, puede causar graves complicaciones, como el debilitamiento del msculo cardaco (miocardiopata) o un accidente cerebrovascular. Cules son las causas? Esta afeccin suele ser causada por otras afecciones que daan el sistema elctrico del corazn. Estas incluyen lo siguiente:  Presin arterial alta (hipertensin arterial). Esta es la causa ms frecuente.  Ciertos problemas o afecciones del corazn, como insuficiencia cardaca, arteriopata coronaria, problemas en las vlvulas cardacas o ciruga cardaca.  Diabetes.  Hiperactividad de la glndula tiroidea (hipertiroidismo).  Obesidad.  Enfermedad renal crnica. En algunos casos, se desconoce la causa de esta afeccin. Qu incrementa el riesgo? Es ms probable que Orthoptist en:  Personas de edad avanzada.  Fumadores.  Deportistas que realizan ejercicios de resistencia.  Las personas que tienen antecedentes familiares de fibrilacin auricular.  Hombres.  Personas que consumen drogas.  Personas que beben mucho alcohol.  Personas con afecciones pulmonares, como enfisema, neumona o EPOC.  Personas que tienen apnea obstructiva del sueo. Cules son los signos o sntomas? Los sntomas de esta afeccin incluyen:  Sensacin de que el corazn late rpida o irregularmente.  Malestar o Tourist information centre manager.  Falta de aire.  Mareos o debilidad repentinos.  Cansarse fcilmente al hacer ejercicio o actividad  fsica.  Fatiga.  Sncope (episodio de desmayo).  Sudoracin. En algunos casos, no hay sntomas. Cmo se diagnostica? El mdico puede detectar la fibrilacin auricular al tomarle el pulso. Si se detecta, esta afeccin podra diagnosticarse a travs de lo siguiente:  Un electrocardiograma (ECG) para controlar las seales elctricas del corazn.  Un monitor cardaco ambulatorio para Financial planner actividad del corazn durante algunos das.  Un ecocardiograma transtorcico (ETT) para obtener imgenes del corazn.  Un ecocardiograma transesofgico (ETE) para obtener imgenes incluso ms cercanas del corazn.  Una ergometra para evaluar la irrigacin sangunea mientras hace ejercicio.  Estudios de diagnstico por imgenes, como una exploracin por tomografa computarizada (TC) o una radiografa torcica.  Anlisis de [place]. Cmo se trata? El tratamiento depende de las afecciones subyacentes y de cmo se siente cuando tiene fibrilacin auricular. El tratamiento de esta afeccin puede incluir:  Medicamentos para prevenir la formacin de cogulos de sangre o tratar los problemas de frecuencia cardaca o ritmo cardaco.  Cardioversin elctrica para restablecer el ritmo cardaco.  Un marcapasos para corregir el ritmo cardaco anormal.  Ablacin para extirpar el tejido cardaco que enva seales anormales.  Cierre de Production manager auricular izquierda para sellar la zona donde pueden formarse cogulos de [place]. En algunos casos, se tratarn las afecciones subyacentes. Siga estas instrucciones en su casa: Medicamentos  Tome los medicamentos de venta libre y los recetados solamente como se lo haya indicado el mdico.  No tome medicamentos nuevos sin antes Teacher, adult education con su mdico.  Si est tomando anticoagulantes, tenga en cuenta lo siguiente: ? Hable con el mdico antes de tomar cualquier medicamento que contenga aspirina o antiinflamatorios no esteroideos (AINE), como el  ibuprofeno. Estos medicamentos aumentan el riesgo de tener una hemorragia peligrosa. ? Tome los medicamentos exactamente como se lo indicaron, [employer] a  la [employer]. ? Evite las actividades que podran causarle lesiones o moretones y [place] cadas. ? Use un brazalete de alerta mdica o lleve una tarjeta con una lista de los medicamentos que toma. Estilo de vida  No consuma ningn producto que contenga nicotina o tabaco, como cigarrillos, cigarrillos electrnicos y tabaco de Higher education careers adviser. Si necesita ayuda para dejar de fumar, consulte al mdico.  Consuma alimentos cardiosaludables. Hable con un nutricionista para crear un plan de alimentacin que sea adecuado para usted.  Haga actividad fsica habitualmente como se lo haya indicado el mdico.  No beba alcohol.  Baje de peso si es necesario.  No consuma drogas, ni siquiera cannabis.      Instrucciones generales  Si tiene apnea obstructiva del sueo, controle la afeccin como se lo haya indicado el mdico.  No tome pastillas para adelgazar a menos que el mdico lo autorice. Estas pastillas pueden agravar los problemas cardacos.  Concurra a todas las visitas de seguimiento como se lo haya indicado el mdico. Esto es importante. Comunquese con un mdico si:  Nota un cambio en la frecuencia, el ritmo o la fuerza de los latidos cardacos.  Toma anticoagulantes y observa ms moretones.  Se cansa con ms facilidad cuando hace ejercicio o hace trabajos pesados.  Tiene cambios repentinos en [employer]. Solicite ayuda inmediatamente si tiene:  Dolor de pecho, dolor abdominal, sudoracin o debilidad.  Dificultad para respirar.  Efectos secundarios de los anticoagulantes, como sangre en el vmito, las heces o la [place], o sangrado que no puede detenerse.  Cualquier sntoma de un accidente cerebrovascular. "BE FAST" es una manera fcil de recordar las principales seales de advertencia de un accidente  cerebrovascular: ? B - Balance (equilibrio). Los signos son mareos, dificultad repentina para caminar o prdida del equilibrio. ? E - Eyes (ojos). Los signos son problemas para ver o un cambio repentino en la visin. ? F - Face (rostro). Los signos son debilidad repentina o adormecimiento del rostro, o el rostro o el prpado que se caen hacia un lado. ? A - Arms (brazos). Los signos son debilidad o adormecimiento en un brazo. Esto sucede de repente y generalmente en un lado del cuerpo. ? S - Speech (habla). Los signos son dificultad para hablar, hablar arrastrando las palabras o dificultad para comprender lo que la gente dice. ? T - Time (tiempo). Es tiempo de llamar al servicio de Multimedia programmer. Anote la hora a la que [employer] sntomas.  Otros signos de accidente cerebrovascular, tales como: ? Dolor de cabeza sbito e intenso que no tiene causa aparente. ? Nuseas o vmitos. ? Convulsiones. Estos sntomas pueden representar un problema grave que constituye Engineer, maintenance (IT). No espere a ver si los sntomas desaparecen. Solicite atencin mdica de inmediato. Comunquese con el servicio de emergencias de su localidad (911 en los [place]). No conduzca por sus propios medios Principal Financial.   Resumen  La fibrilacin auricular es un tipo de latido cardaco irregular o rpido (arritmia).  Los sntomas incluyen sensacin de que el corazn late rpida o irregularmente.  Es posible que le den medicamentos para prevenir la formacin de cogulos de sangre o tratar los problemas de frecuencia cardaca o ritmo cardaco.  Obtenga ayuda de inmediato si tiene signos o sntomas de un accidente cerebrovascular.  Busque ayuda de inmediato si no puede respirar o siente dolor o presin en el pecho. Esta informacin no tiene Marine scientist el consejo del mdico. Asegrese de hacerle  al mdico cualquier pregunta que tenga. Document Revised: 08/31/2018 Document Reviewed: 08/31/2018 Elsevier  Patient Education  [place] de alimentacin para la insuficiencia cardaca Heart Failure Eating Plan La insuficiencia cardaca, tambin llamada insuficiencia cardaca congestiva, ocurre cuando el corazn no bombea la sangre lo suficientemente bien como para Engineer, water la necesidad del organismo de sangre rica en oxgeno. La insuficiencia cardaca es una enfermedad de larga duracin (crnica). Vivir con insuficiencia cardaca puede ser difcil. Seguir las instrucciones del mdico acerca de Theatre manager un estilo de vida saludable y trabajar con un especialista en alimentacin y nutricin (nutricionista) para elegir los alimentos adecuados puede mejorar los sntomas. Un plan de alimentacin para una persona con insuficiencia cardaca incluir cambios que limiten la ingesta de sal (sodio) y grasa no saludable. Consejos para seguir Photographer las etiquetas de los alimentos  Verifique la cantidad de sodio por porcin en las etiquetas de los alimentos. Elija alimentos que contengan menos de 140mg  (miligramos) de sodio por porcin.  Lea las etiquetas de los alimentos para conocer la cantidad de caloras por porcin. Esto es importante si debe limitar la ingesta diaria de caloras para Horticulturist, commercial.  Consulte las etiquetas de los alimentos para conocer el tamao de las porciones. Si come ms de una porcin, consumir ms sodio y caloras que lo indicado en la etiqueta.  Busque los alimentos etiquetados como "sin sodio", "muy bajo en sodio" o "bajo en sodio". ? Los alimentos [employer] "sodio reducido" o "ligeramente salado" pueden contener ms sodio que el recomendado para usted. Al cocinar  Evite agregar sal cuando cocine. Consulte al mdico o al nutricionista antes de usar sustitutos de la sal.  Condimente la comida con condimentos, especias o hierbas sin sal. Lea la etiqueta de las mezclas de condimentos para asegurarse de que no contengan sal.  Cocine con aceites  cardiosaludables, como oliva, canola, soja o girasol.  No fra los alimentos. A la hora de cocinarlos, utilice mtodos con bajo contenido de [place], como hornearlos, hervirlos, Educational psychologist y asarlos a Administrator, arts.  Limite las grasas poco saludables haciendo lo siguiente: ? Quite la piel a la carne de ave, como el pollo. ? Quite todas las grasas visibles de las carnes. ? Retire la grasa de los guisos, las sopas y las salsas antes de servirlos. Planificacin de las comidas  Limite su consumo de: ? Alimentos procesados, enlatados o empaquetados. ? Alimentos con alto contenido de grasas trans, como los alimentos fritos. ? Dulces, postres, bebidas azucaradas y otros alimentos con azcar agregada. ? Alimentos lcteos enteros, como la [place].  Consuma una dieta equilibrada. Esto puede incluir: ? [place] 4 y 5porciones de [place], y [place] 4 y 5porciones de verduras [place]. Trate de que una mitad del plato de cada comida sean frutas y verduras. ? Hasta 6 u 8 porciones de cereales integrales por da. ? Hasta 2porciones de carne [place], de ave o de [employer]. Una porcin de carne equivale a 3onzas (85gramos). Esto es casi el mismo tamao que una baraja de cartas. ? Dos porciones de productos lcteos descremados por Training and development officer. ? Grasas cardiosaludables. Las grasas saludables llamadas cidos grasos omega-3 se encuentran en alimentos como las semillas de lino y los pescados de agua fra, como la sardina, el salmn y la caballa.  Trate de consumir entre 25 y 35g (gramos) de Conservator, museum/gallery. Los alimentos con alto contenido de fibra incluyen [place], brcoli, zanahorias, frijoles, guisantes y Psychologist, prison and probation services.  No agregue sal o condimentos que contengan sal (como salsa de soja) a la comida antes de comer.  Cuando coma en un restaurante, pida que preparen su comida con menos sal o, en lo posible, sin nada de sal.  Trate de comer al menos 2 comidas vegetarianas por  semana.  Consuma ms comida casera y menos de restaurante, de buf y comida rpida.   Informacin general  No consuma ms de 2300mg  de Electrical engineer. La cantidad de [employer] recomienden puede ser ms baja, segn su afeccin.  Mantenga un peso corporal saludable, segn las indicaciones. Pregntele al mdico cul es un peso saludable para usted. ? Controle su [place]. ? Trabaje con el mdico y el nutricionista para elaborar un plan que sea adecuado para usted con el objetivo de Horticulturist, commercial o Theatre manager su peso actual.  Limite la cantidad de lquido que bebe. Pregntele al mdico o al nutricionista cunto lquido puede beber por Training and development officer.  Limite o evite el consumo de alcohol como se lo hayan indicado el mdico o el nutricionista. Alimentos recomendados [employer] frutas frescas, congeladas y [place]. Frutas secas, como pasas, ciruelas pasa y arndanos. [place] verduras frescas. Las verduras congeladas sin salsa o sal agregada. Verduras enlatadas con bajo contenido de sodio o trigo. Cereal fro integral o de salvado. Carnes y otros alimentos ricos en protenas [place] de carne [place]. Pollo y pavo sin piel. Pescados con alto contenido de cidos grasos omega-3, como el salmn, la sardina y otros pescados de agua fra. Huevos. Frijoles secos, guisantes y [place]. Frutos secos y [place] de [place] sin [place] en polvo con bajo contenido de grasa o sin grasa (descremada). Leche de arroz, [place] de soja y [place] de [place]. Yogur semidescremado o descremado. Cantidades pequeas de cubos de queso con cantidad reducida de sodio. Queso cottage con bajo contenido de sodio. Grasas y aceites Aceite de [place], canola, soja, lino, aguacate o [place]. Dulces y postres Pur de Firefighter. Barras de granola. Budines y gelatina sin azcar. Barras de fruta congeladas. Condimentos y otros alimentos Hierbas frescas y secas. Jugo de [place]. [place]. [place] con  bajo contenido de [place]. Adobos, aderezos para ensaladas, salsas y condimentos sin sal. Es posible que los productos que se enumeran ms [place] no constituyan una lista completa de los alimentos y las bebidas que puede tomar. Consulte a un nutricionista para obtener ms informacin. Alimentos que deben evitarse Frutas Frutas secas con conservantes que tienen sodio. Verduras Verduras enlatadas. Verduras congeladas con salsa o condimentos. Verduras con crema. Papas fritas. Anillos de cebollas. Verduras encurtidas y chucrut. Granos Pan con no ms de 80mg  de sodio por rebanada. Cereal caliente o fro con ms de 140mg  de sodio por porcin. Pretzels y galletas salados. Pan rallado empaquetado. Bagels, croissants y galletas. Carnes y otros alimentos ricos en protenas [place] y [place] de pollo. [place], jamn, pepperoni, boloesa, salame y fiambres empaquetados. Hot dogs, bratwurst y salchichas. Carnes enlatadas. Carnes y [employer]. Frutos secos y semillas con sal. Lcteos Leche [place], mezcla de [place] y crema, y crema. Suero de [place]. Quesos procesados, quesos para untar y [place]. [place] comn. Queso feta. Queso rallado. Queso en hebras. Grasas y aceites [place], [place] de [place], [place], [place] clarificada y Wendee Copp de tocino. Salsas en lata y envasadas. Condimentos y otros alimentos Sal de cebolla y ajo, sal de mesa y sal marina. Adobos. Aderezos comunes para ensalada. Salsas, pepinillos y [place]. Saborizantes y ablandadores  de carne, y cubos de caldo. Rbano picante, ktchup y [place]. Salsa [place]. Salsa teriyaki, salsa de soja (incluso la que tiene contenido reducido de sodio). Salsa picante y [place], salsa de pescado, salsa de [place] y salsa cctel. Aderezos para tacos. Salsa barbacoa. Salsa trtara. Es posible que los productos que se enumeran ms [place] no constituyan una lista completa de los alimentos y las bebidas que Materials engineer. Consulte a un nutricionista para obtener ms informacin. Resumen  Un plan de alimentacin para la insuficiencia cardaca incluye la implementacin de cambios para limitar la ingesta de sodio y grasas poco saludables, y puede ayudarlo a Horticulturist, commercial o a Theatre manager un peso saludable. El mdico tambin puede recomendarle que limite la cantidad de lquido que toma.  La mayora de las personas con insuficiencia cardaca no deben consumir ms de 2,300mg  de sal (sodio) por Training and development officer. La cantidad de [employer] recomienden puede ser ms baja, segn su afeccin.  Comunquese con el mdico o el nutricionista antes de implementar cambios significativos en la alimentacin. Esta informacin no tiene Marine scientist el consejo del mdico. Asegrese de hacerle al mdico cualquier pregunta que tenga. Document Revised: 11/08/2019 Document Reviewed: 09/28/2019 Elsevier Patient Education  2021 [place] del peso diario Daily [employer] Record Es importante que se pese diariamente. Haga lo siguiente:  Asegrese de Risk manager una balanza confiable. [place].  Guarde esta tabla del peso diario cerca de la balanza.  Psese todas las maanas a la [employer].  Antes de pesarse: ? Qutese los zapatos. ? Asegrese de [employer] cantidad de ropa todos [place].  Anote su peso en los espacios en el formulario.  Compare el peso de hoy con el de Visual merchandiser.  Traiga este formulario a las visitas de seguimiento con su mdico. Llame al mdico si tiene inquietudes en cuanto a su peso, incluido si aumenta o baja rpidamente de [place]. Fecha: ________ Peso: ____________________ Toma Copier: ________ Peso: ____________________ Toma Copier: ________ Peso: ____________________ Toma Copier: ________ Peso: ____________________ Toma Copier: ________ Peso: ____________________ Toma Copier: ________ Peso: ____________________ Toma Copier: ________ Peso: ____________________ Toma Copier: ________ Peso:  ____________________ Toma Copier: ________ Peso: ____________________ Toma Copier: ________ Peso: ____________________ Toma Copier: ________ Peso: ____________________ Toma Copier: ________ Peso: ____________________ Toma Copier: ________ Peso: ____________________ Toma Copier: ________ Peso: ____________________ Toma Copier: ________ Peso: ____________________ Toma Copier: ________ Peso: ____________________ Toma Copier: ________ Peso: ____________________ Toma Copier: ________ Peso: ____________________ Toma Copier: ________ Peso: ____________________ Toma Copier: ________ Peso: ____________________ Toma Copier: ________ Peso: ____________________ Toma Copier: ________ Peso: ____________________ Toma Copier: ________ Peso: ____________________ Toma Copier: ________ Peso: ____________________ Toma Copier: ________ Peso: ____________________ Toma Copier: ________ Peso: ____________________ Toma Copier: ________ Peso: ____________________ Toma Copier: ________ Peso: ____________________ Toma Copier: ________ Peso: ____________________ Toma Copier: ________ Peso: ____________________ Toma Copier: ________ Peso: ____________________ Toma Copier: ________ Peso: ____________________ Toma Copier: ________ Peso: ____________________ Toma Copier: ________ Peso: ____________________ Toma Copier: ________ Peso: ____________________ Toma Copier: ________ Peso: ____________________ Toma Copier: ________ Peso: ____________________ Toma Copier: ________ Peso: ____________________ Toma Copier: ________ Peso: ____________________ Toma Copier: ________ Peso: ____________________ Toma Copier: ________ Peso: ____________________ Toma Copier: ________ Peso: ____________________ Toma Copier: ________ Peso: ____________________ Toma Copier: ________ Peso: ____________________ Toma Copier: ________ Peso: ____________________ Toma Copier: ________ Peso: ____________________ Toma Copier: ________ Peso: ____________________ Toma Copier: ________ Peso: ____________________ Toma Copier: ________ Peso: ____________________ Toma Copier: ________ Peso: ____________________ Esta informacin no tiene como fin reemplazar el consejo del mdico. Asegrese de hacerle al mdico  cualquier pregunta que tenga. Document Revised: 02/26/2017 Document Reviewed: 02/26/2017 Elsevier Patient Education  2021 [employer].

## 2020-05-25 NOTE — Plan of Care (Signed)
Nutrition Education Note  RD consulted for nutrition education regarding CHF.  Spoke with pt and wife at bedside with assistance of [employer] Interpreter Allena Katz [phone]). Pt shares that he was hospitalized in [facility] 1-2 months ago for similar complaints. Since prior hospitalization, he shares that he had a poor appetite. Per pt wife, pt typically consumes 2-3 meals per day (Breakfast: coffee, milk, and toast; Lunch and Dinner: rice, beans, meat, and vegetable). Pt reports that he consumes only water. Wife shares at she seasons with garlic salt and pt complains that food has nor flavor when using garlic powder.   Pt shares that his DM typically well controlled, and typically receives CBG readings around 140. He has not been able to check his weight or CBGS over the past 2 weeks, as they have recently relocated to [facility] and his medical supplies are mistakenly still in the moving truck.   Pt estimates his UBW is around 255# and endorses a 20# wt loss over the past month. Suspect some wt loss is related to diuresis.   Nutrition-Focused physical exam completed. Findings are no fat depletion, no muscle depletion, and mild edema.  Focus of education was on CHF management.     RD provided "Low Sodium Nutrition Therapy" handout from the Academy of Nutrition and Dietetics (provided in preferred language of Spanish). Reviewed patient's dietary recall. Provided examples on ways to decrease sodium intake in diet. Discouraged intake of processed foods and use of salt shaker. Encouraged fresh fruits and vegetables as well as whole grain sources of carbohydrates to maximize fiber intake.   RD discussed why it is important for patient to adhere to diet recommendations, and emphasized the role of fluids, foods to avoid, and importance of weighing self daily. Teach back method used.  Expect fair to good compliance.  Current diet order is heart healthy/ carb modified, patient is consuming approximately 100%  of meals at this time. Labs and medications reviewed. No further nutrition interventions warranted at this time. RD contact information provided. If additional nutrition issues arise, please re-consult RD.   Loistine Chance, RD, LDN, [place] Registered Dietitian II Certified Diabetes Care and Education Specialist Please refer to [facility] for RD and/or RD on-call/weekend/after hours pager

## 2020-06-05 DIAGNOSIS — I10 Essential (primary) hypertension: Secondary | ICD-10-CM | POA: Insufficient documentation

## 2020-06-05 DIAGNOSIS — E119 Type 2 diabetes mellitus without complications: Secondary | ICD-10-CM | POA: Insufficient documentation

## 2020-06-05 DIAGNOSIS — I4891 Unspecified atrial fibrillation: Secondary | ICD-10-CM | POA: Insufficient documentation

## 2020-06-06 ENCOUNTER — Encounter: Payer: Self-pay | Admitting: Cardiology

## 2020-06-06 ENCOUNTER — Ambulatory Visit: Payer: Medicare Other | Admitting: Cardiology

## 2020-06-06 ENCOUNTER — Other Ambulatory Visit: Payer: Self-pay

## 2020-06-06 ENCOUNTER — Other Ambulatory Visit: Payer: Self-pay | Admitting: Cardiology

## 2020-06-06 VITALS — BP 118/72 | HR 84 | Ht 70.0 in | Wt 213.6 lb

## 2020-06-06 DIAGNOSIS — I350 Nonrheumatic aortic (valve) stenosis: Secondary | ICD-10-CM

## 2020-06-06 DIAGNOSIS — I4819 Other persistent atrial fibrillation: Secondary | ICD-10-CM

## 2020-06-06 DIAGNOSIS — R0989 Other specified symptoms and signs involving the circulatory and respiratory systems: Secondary | ICD-10-CM | POA: Diagnosis not present

## 2020-06-06 DIAGNOSIS — I4891 Unspecified atrial fibrillation: Secondary | ICD-10-CM

## 2020-06-06 DIAGNOSIS — L989 Disorder of the skin and subcutaneous tissue, unspecified: Secondary | ICD-10-CM | POA: Diagnosis not present

## 2020-06-06 DIAGNOSIS — I5032 Chronic diastolic (congestive) heart failure: Secondary | ICD-10-CM | POA: Diagnosis not present

## 2020-06-06 DIAGNOSIS — I255 Ischemic cardiomyopathy: Secondary | ICD-10-CM | POA: Diagnosis not present

## 2020-06-06 DIAGNOSIS — I2510 Atherosclerotic heart disease of native coronary artery without angina pectoris: Secondary | ICD-10-CM | POA: Diagnosis not present

## 2020-06-06 DIAGNOSIS — I340 Nonrheumatic mitral (valve) insufficiency: Secondary | ICD-10-CM

## 2020-06-06 DIAGNOSIS — N401 Enlarged prostate with lower urinary tract symptoms: Secondary | ICD-10-CM | POA: Insufficient documentation

## 2020-06-06 DIAGNOSIS — I5022 Chronic systolic (congestive) heart failure: Secondary | ICD-10-CM | POA: Insufficient documentation

## 2020-06-06 DIAGNOSIS — I482 Chronic atrial fibrillation, unspecified: Secondary | ICD-10-CM | POA: Diagnosis not present

## 2020-06-06 DIAGNOSIS — E782 Mixed hyperlipidemia: Secondary | ICD-10-CM | POA: Diagnosis not present

## 2020-06-06 DIAGNOSIS — I509 Heart failure, unspecified: Secondary | ICD-10-CM | POA: Diagnosis not present

## 2020-06-06 DIAGNOSIS — E119 Type 2 diabetes mellitus without complications: Secondary | ICD-10-CM | POA: Diagnosis not present

## 2020-06-06 DIAGNOSIS — I110 Hypertensive heart disease with heart failure: Secondary | ICD-10-CM | POA: Insufficient documentation

## 2020-06-06 MED ORDER — METOPROLOL SUCCINATE ER 50 MG PO TB24: 50.0000 mg | ORAL_TABLET | Freq: Every day | ORAL | 3 refills | Status: DC

## 2020-06-06 MED ORDER — FUROSEMIDE 20 MG PO TABS: 20.0000 mg | ORAL_TABLET | ORAL | 2 refills | Status: AC

## 2020-06-06 NOTE — Patient Instructions (Signed)
Medication Instructions:  Your physician has recommended you make the following change in your medication: STOP: Lopressor START: Toprol-XL 50 mg daily START: Lasix 20 mg Tuesday and Saturday *If you need a refill on your cardiac medications before your next appointment, please call your pharmacy*   Lab Work: Your physician recommends that you return for lab work in: TODAY: BMET, [place] If you have labs (blood work) drawn today and your tests are completely normal, you will receive your results only by: Marland Kitchen MyChart Message (if you have MyChart) OR . A paper copy in the mail If you have any lab test that is abnormal or we need to change your treatment, we will call you to review the results.   Testing/Procedures: None   Follow-Up: At [facility], you and your health needs are our priority.  As part of our continuing mission to provide you with exceptional heart care, we have created designated Provider Care Teams.  These Care Teams include your primary Cardiologist (physician) and Advanced Practice Providers (APPs -  Physician Assistants and Nurse Practitioners) who all work together to provide you with the care you need, when you need it.  We recommend signing up for the patient portal called "MyChart".  Sign up information is provided on this After Visit Summary.  MyChart is used to connect with patients for Virtual Visits (Telemedicine).  Patients are able to view lab/test results, encounter notes, upcoming appointments, etc.  Non-urgent messages can be sent to your provider as well.   To learn more about what you can do with MyChart, go to [email or web address].    Your next appointment:   6 week(s)  The format for your next appointment:   In Person  Provider:   Berniece Salines, DO   Other Instructions

## 2020-06-06 NOTE — Progress Notes (Signed)
Cardiology Office Note:    Date:  06/08/2020   ID:  Austin Gomez, DOB [birth year], MRN [id number]  PCP:  Maggie Schwalbe, PA-C  Cardiologist:  Berniece Salines, DO  Electrophysiologist:  None   Referring MD: [place] Austin J, DO   I am doing fine  History of Present Illness:    Austin Gomez is a 75 y.o. male with a hx of coronary artery disease status post PCI in 2006 in February 2002 in [place], ischemic cardiomyopathy EF 40 to 45%, paroxysmal atrial fibrillation on Eliquis and metoprolol, diabetes mellitus, hyperlipidemia and hypertension.  The patient is here with his daughter.  He speaks predominantly Spanish but this visit was facilitated by: Health in person interpreter.  He offers no complaints in office today.  He recently was hospitalized at [facility] where he presented for shortness of breath he was noted to be in atrial fibrillation with rapid ventricular rate.  While he was in the hospital he was rate controlled.  He did have elevated troponin which was suspected to be demand ischemia given his RVR.  He is here today and offer no complaints at this time.  Past Medical History:  Diagnosis Date  . A-fib ([place])   . Coronary artery disease involving native coronary artery of native heart 05/23/2020  . Diabetes mellitus without complication ([place])   . Essential hypertension 05/23/2020  . Hypertension   . Mixed hyperlipidemia 05/23/2020  . Type 2 diabetes mellitus with hyperglycemia, without long-term current use of insulin ([place]) 05/23/2020    Past Surgical History:  Procedure Laterality Date  . CORONARY ANGIOPLASTY WITH STENT PLACEMENT      Current Medications: Current Meds  Medication Sig  . atorvastatin (LIPITOR) 40 MG tablet Take 1 tablet (40 mg total) by mouth every evening.  . blood glucose meter kit and supplies Dispense based on patient and insurance preference. Use up to four times daily as directed. (FOR ICD-10 E10.9, E11.9).  Marland Kitchen clopidogrel (PLAVIX) 75 MG  tablet Take 1 tablet (75 mg total) by mouth daily.  . DULoxetine (CYMBALTA) 60 MG capsule Take 60 mg by mouth every evening.  Marland Kitchen ELIQUIS 5 MG TABS tablet Take 1 tablet (5 mg total) by mouth 2 (two) times daily.  . furosemide (LASIX) 20 MG tablet Take 1 tablet (20 mg total) by mouth 2 (two) times a week.  . gabapentin (NEURONTIN) 600 MG tablet Take 600 mg by mouth 3 (three) times daily.  Marland Kitchen glipiZIDE (GLUCOTROL) 10 MG tablet Take 2 tablets (20 mg total) by mouth 2 (two) times daily before a meal.  . losartan (COZAAR) 25 MG tablet Take 1 tablet (25 mg total) by mouth daily.  . metFORMIN (GLUCOPHAGE) 1000 MG tablet Take 1 tablet (1,000 mg total) by mouth 2 (two) times daily with a meal.  . metoprolol succinate (TOPROL-XL) 50 MG 24 hr tablet Take 1 tablet (50 mg total) by mouth daily. Take with or immediately following a meal.  . Multiple Vitamins-Minerals (MULTIVITAMIN WITH MINERALS) tablet Take 1 tablet by mouth daily.  Marland Kitchen OZEMPIC, 1 MG/DOSE, 4 MG/3ML SOPN Inject 1 mg into the skin once a week.  . [DISCONTINUED] furosemide (LASIX) 20 MG tablet Take 1 tablet (20 mg total) by mouth daily as needed for fluid or edema (take 33m for weight gain >3lbs in one day or 5 lbs in one week).  . [DISCONTINUED] metoprolol tartrate (LOPRESSOR) 50 MG tablet Take 1 tablet (50 mg total) by mouth 2 (two) times daily.  Allergies:   Patient has no known allergies.   Social History   Socioeconomic History  . Marital status: Married    Spouse name: Not on file  . Number of children: Not on file  . Years of education: Not on file  . Highest education level: Not on file  Occupational History  . Not on file  Tobacco Use  . Smoking status: Never Smoker  . Smokeless tobacco: Never Used  Substance and Sexual Activity  . Alcohol use: Not on file  . Drug use: Not on file  . Sexual activity: Not on file  Other Topics Concern  . Not on file  Social History Narrative  . Not on file   Social Determinants of Health    Financial Resource Strain: Not on file  Food Insecurity: Not on file  Transportation Needs: Not on file  Physical Activity: Not on file  Stress: Not on file  Social Connections: Not on file     Family History: The patient's family history is negative for Heart disease.  ROS:   Review of Systems  Constitution: Negative for decreased appetite, fever and weight gain.  HENT: Negative for congestion, ear discharge, hoarse voice and sore throat.   Eyes: Negative for discharge, redness, vision loss in right eye and visual halos.  Cardiovascular: Negative for chest pain, dyspnea on exertion, leg swelling, orthopnea and palpitations.  Respiratory: Negative for cough, hemoptysis, shortness of breath and snoring.   Endocrine: Negative for heat intolerance and polyphagia.  Hematologic/Lymphatic: Negative for bleeding problem. Does not bruise/bleed easily.  Skin: Negative for flushing, nail changes, rash and suspicious lesions.  Musculoskeletal: Negative for arthritis, joint pain, muscle cramps, myalgias, neck pain and stiffness.  Gastrointestinal: Negative for abdominal pain, bowel incontinence, diarrhea and excessive appetite.  Genitourinary: Negative for decreased libido, genital sores and incomplete emptying.  Neurological: Negative for brief paralysis, focal weakness, headaches and loss of balance.  Psychiatric/Behavioral: Negative for altered mental status, depression and suicidal ideas.  Allergic/Immunologic: Negative for HIV exposure and persistent infections.    EKGs/Labs/Other Studies Reviewed:    The following studies were reviewed today:   EKG:  The ekg ordered today demonstrates   05/24/2020 IMPRESSIONS  1. Left ventricular ejection fraction, by estimation, is 40 to 45%. The left ventricle has mildly decreased function. The left ventricle  demonstrates global hypokinesis. There is moderate asymmetric left ventricular hypertrophy of the basal-septal segment. Left ventricular  diastolic function could not be evaluated.  2. Right ventricular systolic function is mildly reduced. The right ventricular size is mildly enlarged. There is normal pulmonary artery  systolic pressure. The estimated right ventricular systolic pressure is 27.7 mmHg.  3. Left atrial size was mildly dilated.  4. Right atrial size was mildly dilated.  5. A small pericardial effusion is present. The pericardial effusion is circumferential.  6. The mitral valve is grossly normal. Mild mitral valve regurgitation. No evidence of mitral stenosis.  7. The aortic valve is tricuspid. There is moderate calcification of the aortic valve. There is mild thickening of the aortic valve. Aortic valve regurgitation is not visualized. Mild aortic valve stenosis. 8. Aortic dilatation noted. There is mild dilatation of the aortic root, measuring 42 mm. There is mild dilatation of the ascending aorta, measuring 42 mm.  9. The inferior vena cava is normal in size with <50% respiratory variability, suggesting right atrial pressure of 8 mmHg.   FINDINGS  Left Ventricle: Left ventricular ejection fraction, by estimation, is 40 to 45%. The left ventricle  has mildly decreased function. The left  ventricle demonstrates global hypokinesis. The left ventricular internal cavity size was normal in size. There is  moderate asymmetric left ventricular hypertrophy of the basal-septal segment. Left ventricular diastolic function could not be evaluated due to atrial fibrillation. Left ventricular diastolic function could not be evaluated.   Right Ventricle: The right ventricular size is mildly enlarged. No increase in right ventricular wall thickness. Right ventricular systolic  function is mildly reduced. There is normal pulmonary artery systolic pressure. The tricuspid regurgitant velocity  is 1.76 m/s, and with an assumed right atrial pressure of 8 mmHg, the estimated right ventricular systolic pressure is 03.5 mmHg.    Left Atrium: Left atrial size was mildly dilated.   Right Atrium: Right atrial size was mildly dilated.   Pericardium: A small pericardial effusion is present. The pericardial effusion is circumferential.   Mitral Valve: The mitral valve is grossly normal. Mild mitral valve regurgitation. No evidence of mitral valve stenosis.   Tricuspid Valve: The tricuspid valve is grossly normal. Tricuspid valve regurgitation is mild . No evidence of tricuspid stenosis.   Aortic Valve: The aortic valve is tricuspid. There is moderate calcification of the aortic valve. There is mild thickening of the aortic  valve. Aortic valve regurgitation is not visualized. Mild aortic stenosis is present. Aortic valve mean gradient  measures 4.0 mmHg. Aortic valve peak gradient measures 6.1 mmHg. Aortic valve area, by VTI measures 2.51 cm.  Pulmonic Valve: The pulmonic valve was grossly normal. Pulmonic valve regurgitation is not visualized. No evidence of pulmonic stenosis.   Aorta: Aortic dilatation noted. There is mild dilatation of the aortic root, measuring 42 mm. There is mild dilatation of the ascending aorta,  measuring 42 mm.   Venous: The inferior vena cava is normal in size with less than 50% respiratory variability, suggesting right atrial pressure of 8 mmHg.   IAS/Shunts: The atrial septum is grossly normal.      Recent Labs: 05/23/2020: ALT 17; B Natriuretic Peptide 170.2; Hemoglobin 13.2; Platelets 180; TSH 2.190 05/25/2020: BUN 19; Creatinine, Ser 0.88; Magnesium 1.9; Potassium 3.8; Sodium 135  Recent Lipid Panel No results found for: CHOL, TRIG, HDL, CHOLHDL, VLDL, LDLCALC, LDLDIRECT  Physical Exam:    VS:  BP 118/72 (BP Location: Right Arm)   Pulse 84   Ht '5\' 10"'  (1.778 m)   Wt 213 lb 9.6 oz (96.9 kg)   SpO2 98%   BMI 30.65 kg/m     Wt Readings from Last 3 Encounters:  06/06/20 213 lb 9.6 oz (96.9 kg)  05/25/20 207 lb 12.8 oz (94.3 kg)     GEN: Well nourished, well developed in  no acute distress HEENT: Normal NECK: No JVD; No carotid bruits LYMPHATICS: No lymphadenopathy CARDIAC: S1S2 noted,RRR, no murmurs, rubs, gallops RESPIRATORY:  Clear to auscultation without rales, wheezing or rhonchi  ABDOMEN: Soft, non-tender, non-distended, +bowel sounds, no guarding. EXTREMITIES: + 1 bilateral leg edema, No cyanosis, no clubbing MUSCULOSKELETAL:  No deformity  SKIN: Warm and dry NEUROLOGIC:  Alert and oriented x 3, non-focal PSYCHIATRIC:  Normal affect, good insight  ASSESSMENT:    1. Coronary artery disease involving native coronary artery of native heart, unspecified whether angina present   2. Persistent atrial fibrillation ([place])   3. Depressed left ventricular ejection fraction   4. Ischemic cardiomyopathy   5. Mild aortic stenosis   6. Mild mitral regurgitation    PLAN:     He does have right bilateral leg edema mobility is  at Lasix 20 mg twice a week to his medical regimen.  He is on Lopressor 25 mg twice daily I am going to convert this to a heart failure beta-blocker Toprol-XL 50 mg daily.  I talked to the patient and his daughter about possibly once his blood pressure can tolerate it we will convert from losartan to [facility] in the future.  No angina symptoms.   The patient understands the need to lose weight with diet and exercise. We have discussed specific strategies for this.  The patient is in agreement with the above plan. The patient left the office in stable condition.  The patient will follow up in 6 weeks or sooner due to blood pressure change.   Medication Adjustments/Labs and Tests Ordered: Current medicines are reviewed at length with the patient today.  Concerns regarding medicines are outlined above.  Orders Placed This Encounter  Procedures  . Basic metabolic panel  . Magnesium  . EKG 12-Lead   Meds ordered this encounter  Medications  . metoprolol succinate (TOPROL-XL) 50 MG 24 hr tablet    Sig: Take 1 tablet (50 mg total) by  mouth daily. Take with or immediately following a meal.    Dispense:  90 tablet    Refill:  3  . furosemide (LASIX) 20 MG tablet    Sig: Take 1 tablet (20 mg total) by mouth 2 (two) times a week.    Dispense:  35 tablet    Refill:  2    Patient Instructions  Medication Instructions:  Your physician has recommended you make the following change in your medication: STOP: Lopressor START: Toprol-XL 50 mg daily START: Lasix 20 mg Tuesday and Saturday *If you need a refill on your cardiac medications before your next appointment, please call your pharmacy*   Lab Work: Your physician recommends that you return for lab work in: TODAY: BMET, [place] If you have labs (blood work) drawn today and your tests are completely normal, you will receive your results only by: Marland Kitchen MyChart Message (if you have MyChart) OR . A paper copy in the mail If you have any lab test that is abnormal or we need to change your treatment, we will call you to review the results.   Testing/Procedures: None   Follow-Up: At [facility], you and your health needs are our priority.  As part of our continuing mission to provide you with exceptional heart care, we have created designated Provider Care Teams.  These Care Teams include your primary Cardiologist (physician) and Advanced Practice Providers (APPs -  Physician Assistants and Nurse Practitioners) who all work together to provide you with the care you need, when you need it.  We recommend signing up for the patient portal called "MyChart".  Sign up information is provided on this After Visit Summary.  MyChart is used to connect with patients for Virtual Visits (Telemedicine).  Patients are able to view lab/test results, encounter notes, upcoming appointments, etc.  Non-urgent messages can be sent to your provider as well.   To learn more about what you can do with MyChart, go to [email or web address].    Your next appointment:   6 week(s)  The format for your  next appointment:   In Person  Provider:   Berniece Salines, DO   Other Instructions      Adopting a Healthy Lifestyle.  Know what a healthy weight is for you (roughly BMI <25) and aim to maintain this   Aim for 7+ servings of fruits and  vegetables daily   65-80+ fluid ounces of water or unsweet tea for healthy kidneys   Limit to max 1 drink of alcohol per day; avoid smoking/tobacco   Limit animal fats in diet for cholesterol and heart health - choose grass fed whenever available   Avoid highly processed foods, and foods high in saturated/trans fats   Aim for low stress - take time to unwind and care for your mental health   Aim for 150 min of moderate intensity exercise weekly for heart health, and weights twice weekly for bone health   Aim for 7-9 hours of sleep daily   When it comes to diets, agreement about the perfect plan isnt easy to find, even among the experts. Experts at the [place] developed an idea known as the Healthy Eating Plate. Just imagine a plate divided into logical, healthy portions.   The emphasis is on diet quality:   Load up on vegetables and fruits - one-half of your plate: Aim for color and variety, and remember that potatoes dont count.   Go for whole grains - one-quarter of your plate: Whole wheat, barley, wheat berries, quinoa, oats, brown rice, and foods made with them. If you want pasta, go with whole wheat pasta.   Protein power - one-quarter of your plate: Fish, chicken, beans, and nuts are all healthy, versatile protein sources. Limit red meat.   The diet, however, does go beyond the plate, offering a few other suggestions.   Use healthy plant oils, such as olive, canola, soy, corn, sunflower and peanut. Check the labels, and avoid partially hydrogenated oil, which have unhealthy trans fats.   If youre thirsty, drink water. Coffee and tea are good in moderation, but skip sugary drinks and limit milk and dairy  products to one or two daily servings.   The type of carbohydrate in the diet is more important than the amount. Some sources of carbohydrates, such as vegetables, fruits, whole grains, and beans-are healthier than others.   Finally, stay active  Signed, Berniece Salines, DO  06/08/2020 4:02 PM    [facility]

## 2020-06-07 DIAGNOSIS — I4891 Unspecified atrial fibrillation: Secondary | ICD-10-CM | POA: Diagnosis not present

## 2020-06-07 LAB — BASIC METABOLIC PANEL
BUN/Creatinine Ratio: 16 (ref 10–24)
BUN: 13 mg/dL (ref 8–27)
CO2: 23 mmol/L (ref 20–29)
Calcium: 9.7 mg/dL (ref 8.6–10.2)
Chloride: 101 mmol/L (ref 96–106)
Creatinine, Ser: 0.8 mg/dL (ref 0.76–1.27)
Glucose: 148 mg/dL — ABNORMAL HIGH (ref 65–99)
Potassium: 4.4 mmol/L (ref 3.5–5.2)
Sodium: 140 mmol/L (ref 134–144)
eGFR: 92 mL/min/{1.73_m2} (ref >59)

## 2020-06-07 LAB — MAGNESIUM: Magnesium: 1.6 mg/dL (ref 1.6–2.3)

## 2020-06-08 ENCOUNTER — Ambulatory Visit: Payer: Self-pay | Admitting: Family

## 2020-06-08 DIAGNOSIS — R0989 Other specified symptoms and signs involving the circulatory and respiratory systems: Secondary | ICD-10-CM | POA: Insufficient documentation

## 2020-06-08 DIAGNOSIS — I340 Nonrheumatic mitral (valve) insufficiency: Secondary | ICD-10-CM | POA: Insufficient documentation

## 2020-06-08 DIAGNOSIS — I350 Nonrheumatic aortic (valve) stenosis: Secondary | ICD-10-CM | POA: Insufficient documentation

## 2020-06-08 DIAGNOSIS — I255 Ischemic cardiomyopathy: Secondary | ICD-10-CM | POA: Insufficient documentation

## 2020-06-11 ENCOUNTER — Telehealth: Payer: Self-pay | Admitting: Cardiology

## 2020-06-11 MED ORDER — POTASSIUM CHLORIDE CRYS ER 20 MEQ PO TBCR: 20.0000 meq | EXTENDED_RELEASE_TABLET | Freq: Every day | ORAL | 3 refills | Status: DC

## 2020-06-11 NOTE — Telephone Encounter (Signed)
Pt c/o medication issue:  1. Name of Medication: potassium chloride SA (KLOR-CON) 20 MEQ tablet  2. How are you currently taking this medication (dosage and times per day)? Has not started medication  3. Are you having a reaction (difficulty breathing--STAT)? no  4. What is your medication issue? Patient's daughter calling in regards to [employer]. She states they were told the patient should continue potassium, but he was not prescribed it at his appointment. She states Dr. Harriet Masson had said she did not want to change any other medications until his 6 week follow up. She would like a nurse to call her back to discuss, because she is confused.

## 2020-06-11 NOTE — Telephone Encounter (Signed)
See MyChart message for further documentation as this conversation was continued there.

## 2020-06-11 NOTE — Telephone Encounter (Signed)
Tried calling patient. No answer and no voicemail set up for me to leave a message. 

## 2020-06-12 ENCOUNTER — Other Ambulatory Visit: Payer: Self-pay

## 2020-06-12 ENCOUNTER — Telehealth: Payer: Self-pay | Admitting: Cardiology

## 2020-06-12 MED ORDER — LOSARTAN POTASSIUM 25 MG PO TABS: 25.0000 mg | ORAL_TABLET | Freq: Every day | ORAL | 3 refills | Status: AC

## 2020-06-12 MED ORDER — METOPROLOL SUCCINATE ER 50 MG PO TB24: 50.0000 mg | ORAL_TABLET | Freq: Every day | ORAL | 3 refills | Status: AC

## 2020-06-12 NOTE — Telephone Encounter (Signed)
Pt has had intermittent chest pain this am. Denies n/v, diaphoresis, shortness of breath or radiation. Encouraged to call 99 or go directly to the ED if any sx reoccur or are constant. Pt's daughter verbalized understanding and had no additional questions.

## 2020-06-12 NOTE — Telephone Encounter (Signed)
  Pt c/o of Chest Pain: STAT if CP now or developed within 24 hours  1. Are you having CP right now? No pain, just pressure  2. Are you experiencing any other symptoms (ex. SOB, nausea, vomiting, sweating)? When he feels the pressure he feels a little short of breath  3. How long have you been experiencing CP? Pressure started this morning  4. Is your CP continuous or coming and going? Comes and goes, lasts 2-5 minutes  5. Have you taken Nitroglycerin? No ?

## 2020-06-26 DIAGNOSIS — L821 Other seborrheic keratosis: Secondary | ICD-10-CM | POA: Diagnosis not present

## 2020-06-26 DIAGNOSIS — L570 Actinic keratosis: Secondary | ICD-10-CM | POA: Diagnosis not present

## 2020-06-26 DIAGNOSIS — D1801 Hemangioma of skin and subcutaneous tissue: Secondary | ICD-10-CM | POA: Diagnosis not present

## 2020-06-26 DIAGNOSIS — D485 Neoplasm of uncertain behavior of skin: Secondary | ICD-10-CM | POA: Diagnosis not present

## 2020-06-26 DIAGNOSIS — L578 Other skin changes due to chronic exposure to nonionizing radiation: Secondary | ICD-10-CM | POA: Diagnosis not present

## 2020-07-04 ENCOUNTER — Telehealth: Payer: Self-pay

## 2020-07-04 NOTE — Telephone Encounter (Signed)
-----   Message from Berniece Salines, DO sent at 06/13/2020  8:54 AM EDT ----- Labs stable

## 2020-07-04 NOTE — Telephone Encounter (Signed)
Spoke with patient regarding results and recommendation.  Patient verbalizes understanding and is agreeable to plan of care. Advised patient to call back with any issues or concerns.  

## 2020-07-07 DIAGNOSIS — M79601 Pain in right arm: Secondary | ICD-10-CM | POA: Diagnosis not present

## 2020-07-07 DIAGNOSIS — S51811A Laceration without foreign body of right forearm, initial encounter: Secondary | ICD-10-CM | POA: Diagnosis not present

## 2020-07-07 DIAGNOSIS — S80211A Abrasion, right knee, initial encounter: Secondary | ICD-10-CM | POA: Diagnosis not present

## 2020-07-18 DIAGNOSIS — C44311 Basal cell carcinoma of skin of nose: Secondary | ICD-10-CM | POA: Diagnosis not present

## 2020-07-18 DIAGNOSIS — C44321 Squamous cell carcinoma of skin of nose: Secondary | ICD-10-CM | POA: Diagnosis not present

## 2020-07-25 DIAGNOSIS — I509 Heart failure, unspecified: Secondary | ICD-10-CM | POA: Diagnosis not present

## 2020-07-25 DIAGNOSIS — I5022 Chronic systolic (congestive) heart failure: Secondary | ICD-10-CM | POA: Diagnosis not present

## 2020-07-25 DIAGNOSIS — I482 Chronic atrial fibrillation, unspecified: Secondary | ICD-10-CM | POA: Diagnosis not present

## 2020-07-25 DIAGNOSIS — I110 Hypertensive heart disease with heart failure: Secondary | ICD-10-CM | POA: Diagnosis not present

## 2020-07-26 ENCOUNTER — Ambulatory Visit: Payer: Medicare Other | Admitting: Cardiology

## 2020-07-26 ENCOUNTER — Encounter: Payer: Self-pay | Admitting: Cardiology

## 2020-07-26 ENCOUNTER — Other Ambulatory Visit: Payer: Self-pay

## 2020-07-26 VITALS — BP 124/74 | HR 89 | Ht 70.0 in | Wt 215.6 lb

## 2020-07-26 DIAGNOSIS — I10 Essential (primary) hypertension: Secondary | ICD-10-CM

## 2020-07-26 DIAGNOSIS — E782 Mixed hyperlipidemia: Secondary | ICD-10-CM

## 2020-07-26 DIAGNOSIS — E1165 Type 2 diabetes mellitus with hyperglycemia: Secondary | ICD-10-CM

## 2020-07-26 DIAGNOSIS — I255 Ischemic cardiomyopathy: Secondary | ICD-10-CM | POA: Diagnosis not present

## 2020-07-26 DIAGNOSIS — I2510 Atherosclerotic heart disease of native coronary artery without angina pectoris: Secondary | ICD-10-CM

## 2020-07-26 DIAGNOSIS — I350 Nonrheumatic aortic (valve) stenosis: Secondary | ICD-10-CM | POA: Diagnosis not present

## 2020-07-26 DIAGNOSIS — R0989 Other specified symptoms and signs involving the circulatory and respiratory systems: Secondary | ICD-10-CM | POA: Diagnosis not present

## 2020-07-26 DIAGNOSIS — I340 Nonrheumatic mitral (valve) insufficiency: Secondary | ICD-10-CM | POA: Diagnosis not present

## 2020-07-26 DIAGNOSIS — I480 Paroxysmal atrial fibrillation: Secondary | ICD-10-CM

## 2020-07-26 NOTE — Patient Instructions (Addendum)
Medication Instructions:   Your physician recommends that you continue on your current medications as directed. Please refer to the Current Medication list given to you today.  *If you need a refill on your cardiac medications before your next appointment, please call your pharmacy*   Lab Work: None If you have labs (blood work) drawn today and your tests are completely normal, you will receive your results only by: [place] (if you have MyChart) OR A paper copy in the mail If you have any lab test that is abnormal or we need to change your treatment, we will call you to review the results.   Testing/Procedures: None   Follow-Up: At [facility], you and your health needs are our priority.  As part of our continuing mission to provide you with exceptional heart care, we have created designated Provider Care Teams.  These Care Teams include your primary Cardiologist (physician) and Advanced Practice Providers (APPs -  Physician Assistants and Nurse Practitioners) who all work together to provide you with the care you need, when you need it.  We recommend signing up for the patient portal called "MyChart".  Sign up information is provided on this After Visit Summary.  MyChart is used to connect with patients for Virtual Visits (Telemedicine).  Patients are able to view lab/test results, encounter notes, upcoming appointments, etc.  Non-urgent messages can be sent to your provider as well.   To learn more about what you can do with MyChart, go to [email or web address].    Your next appointment:   6 month(s)  The format for your next appointment:   In Person  Provider:   [address] - Berniece Salines, DO   Other Instructions

## 2020-07-26 NOTE — Progress Notes (Signed)
Cardiology Office Note:    Date:  07/27/2020   ID:  [name], DOB [birth year], MRN [id number]  PCP:  Maggie Schwalbe, PA-C  Cardiologist:  Berniece Salines, DO  Electrophysiologist:  None   Referring MD: Maggie Schwalbe, PA-C   No chief complaint on file. I am doing well  History of Present Illness:    Austin Gomez is a 75 y.o. male with a hx of coronary artery disease status post PCI in 2006 in February 2002 in [place], ischemic cardiomyopathy EF 40 to 45%, paroxysmal atrial fibrillation on Eliquis and metoprolol, diabetes mellitus, hyperlipidemia and hypertension.  The patient is here with his daughter.  He speaks predominantly Spanish but this visit was facilitated by: Health in person interpreter.  I saw the patient on Jun 06, 2020 at that time he was post hospitalization.  He did have significant leg edema I started him on Lasix twice weekly.  We also switch his Lopressor to Toprol-XL.  He is here today for follow-up visit.  The patient says he has been doing well from a cardiovascular standpoint, he offers no complaints at this time.  Past Medical History:  Diagnosis Date   A-fib [facility])    Coronary artery disease involving native coronary artery of native heart 05/23/2020   Diabetes mellitus without complication ([place])    Essential hypertension 05/23/2020   Hypertension    Mixed hyperlipidemia 05/23/2020   Type 2 diabetes mellitus with hyperglycemia, without long-term current use of insulin ([place]) 05/23/2020    Past Surgical History:  Procedure Laterality Date   CORONARY ANGIOPLASTY WITH STENT PLACEMENT      Current Medications: Current Meds  Medication Sig   atorvastatin (LIPITOR) 40 MG tablet Take 1 tablet (40 mg total) by mouth every evening.   blood glucose meter kit and supplies Dispense based on patient and insurance preference. Use up to four times daily as directed. (FOR ICD-10 E10.9, E11.9).   clopidogrel (PLAVIX) 75 MG tablet Take 1 tablet (75 mg total)  by mouth daily.   DULoxetine (CYMBALTA) 60 MG capsule Take 60 mg by mouth every evening.   ELIQUIS 5 MG TABS tablet Take 1 tablet (5 mg total) by mouth 2 (two) times daily.   furosemide (LASIX) 20 MG tablet Take 1 tablet (20 mg total) by mouth 2 (two) times a week.   gabapentin (NEURONTIN) 600 MG tablet Take 600 mg by mouth 3 (three) times daily.   glipiZIDE (GLUCOTROL) 10 MG tablet Take 2 tablets (20 mg total) by mouth 2 (two) times daily before a meal.   losartan (COZAAR) 25 MG tablet Take 1 tablet (25 mg total) by mouth daily.   metFORMIN (GLUCOPHAGE) 1000 MG tablet Take 1 tablet (1,000 mg total) by mouth 2 (two) times daily with a meal.   metoprolol succinate (TOPROL-XL) 50 MG 24 hr tablet Take 1 tablet (50 mg total) by mouth daily. Take with or immediately following a meal.   Multiple Vitamins-Minerals (MULTIVITAMIN WITH MINERALS) tablet Take 1 tablet by mouth daily.   OZEMPIC, 1 MG/DOSE, 4 MG/3ML SOPN Inject 1 mg into the skin once a week.   [DISCONTINUED] potassium chloride SA (KLOR-CON) 20 MEQ tablet Take 1 tablet (20 mEq total) by mouth daily.     Allergies:   Patient has no known allergies.   Social History   Socioeconomic History   Marital status: Married    Spouse name: Not on file   Number of children: Not on file   Years of education: Not  on file   Highest education level: Not on file  Occupational History   Not on file  Tobacco Use   Smoking status: Never   Smokeless tobacco: Never  Substance and Sexual Activity   Alcohol use: Not on file   Drug use: Not on file   Sexual activity: Not on file  Other Topics Concern   Not on file  Social History Narrative   Not on file   Social Determinants of Health   Financial Resource Strain: Not on file  Food Insecurity: Not on file  Transportation Needs: Not on file  Physical Activity: Not on file  Stress: Not on file  Social Connections: Not on file     Family History: The patient's family history is negative for  Heart disease.  ROS:   Review of Systems  Constitution: Negative for decreased appetite, fever and weight gain.  HENT: Negative for congestion, ear discharge, hoarse voice and sore throat.   Eyes: Negative for discharge, redness, vision loss in right eye and visual halos.  Cardiovascular: Negative for chest pain, dyspnea on exertion, leg swelling, orthopnea and palpitations.  Respiratory: Negative for cough, hemoptysis, shortness of breath and snoring.   Endocrine: Negative for heat intolerance and polyphagia.  Hematologic/Lymphatic: Negative for bleeding problem. Does not bruise/bleed easily.  Skin: Negative for flushing, nail changes, rash and suspicious lesions.  Musculoskeletal: Negative for arthritis, joint pain, muscle cramps, myalgias, neck pain and stiffness.  Gastrointestinal: Negative for abdominal pain, bowel incontinence, diarrhea and excessive appetite.  Genitourinary: Negative for decreased libido, genital sores and incomplete emptying.  Neurological: Negative for brief paralysis, focal weakness, headaches and loss of balance.  Psychiatric/Behavioral: Negative for altered mental status, depression and suicidal ideas.  Allergic/Immunologic: Negative for HIV exposure and persistent infections.    EKGs/Labs/Other Studies Reviewed:    The following studies were reviewed today:   EKG: None today  05/24/2020 IMPRESSIONS   1. Left ventricular ejection fraction, by estimation, is 40 to 45%. The left ventricle has mildly decreased function. The left ventricle  demonstrates global hypokinesis. There is moderate asymmetric left ventricular hypertrophy of the basal-septal segment. Left ventricular diastolic function could not be evaluated.   2. Right ventricular systolic function is mildly reduced. The right ventricular size is mildly enlarged. There is normal pulmonary artery  systolic pressure. The estimated right ventricular systolic pressure is 93.9 mmHg.   3. Left atrial size  was mildly dilated.   4. Right atrial size was mildly dilated.   5. A small pericardial effusion is present. The pericardial effusion is circumferential.   6. The mitral valve is grossly normal. Mild mitral valve regurgitation. No evidence of mitral stenosis.   7. The aortic valve is tricuspid. There is moderate calcification of the aortic valve. There is mild thickening of the aortic valve. Aortic valve regurgitation is not visualized. Mild aortic valve stenosis.  8. Aortic dilatation noted. There is mild dilatation of the aortic root, measuring 42 mm. There is mild dilatation of the ascending aorta, measuring 42 mm.   9. The inferior vena cava is normal in size with <50% respiratory variability, suggesting right atrial pressure of 8 mmHg.   FINDINGS   Left Ventricle: Left ventricular ejection fraction, by estimation, is 40 to 45%. The left ventricle has mildly decreased function. The left  ventricle demonstrates global hypokinesis. The left ventricular internal cavity size was normal in size. There is   moderate asymmetric left ventricular hypertrophy of the basal-septal segment. Left ventricular diastolic function could not  be evaluated due to atrial fibrillation. Left ventricular diastolic function could not be evaluated.   Right Ventricle: The right ventricular size is mildly enlarged. No increase in right ventricular wall thickness. Right ventricular systolic  function is mildly reduced. There is normal pulmonary artery systolic pressure. The tricuspid regurgitant velocity   is 1.76 m/s, and with an assumed right atrial pressure of 8 mmHg, the estimated right ventricular systolic pressure is 00.8 mmHg.   Left Atrium: Left atrial size was mildly dilated.   Right Atrium: Right atrial size was mildly dilated.   Pericardium: A small pericardial effusion is present. The pericardial effusion is circumferential.   Mitral Valve: The mitral valve is grossly normal. Mild mitral valve regurgitation.  No evidence of mitral valve stenosis.   Tricuspid Valve: The tricuspid valve is grossly normal. Tricuspid valve regurgitation is mild . No evidence of tricuspid stenosis.   Aortic Valve: The aortic valve is tricuspid. There is moderate calcification of the aortic valve. There is mild thickening of the aortic  valve. Aortic valve regurgitation is not visualized. Mild aortic stenosis is present. Aortic valve mean gradient  measures 4.0 mmHg. Aortic valve peak gradient measures 6.1 mmHg. Aortic valve area, by VTI measures 2.51 cm.  Pulmonic Valve: The pulmonic valve was grossly normal. Pulmonic valve regurgitation is not visualized. No evidence of pulmonic stenosis.   Aorta: Aortic dilatation noted. There is mild dilatation of the aortic root, measuring 42 mm. There is mild dilatation of the ascending aorta,  measuring 42 mm.   Venous: The inferior vena cava is normal in size with less than 50% respiratory variability, suggesting right atrial pressure of 8 mmHg.   IAS/Shunts: The atrial septum is grossly normal.  Recent Labs: 05/23/2020: ALT 17; B Natriuretic Peptide 170.2; Hemoglobin 13.2; Platelets 180; TSH 2.190 06/06/2020: BUN 13; Creatinine, Ser 0.80; Magnesium 1.6; Potassium 4.4; Sodium 140  Recent Lipid Panel No results found for: CHOL, TRIG, HDL, CHOLHDL, VLDL, LDLCALC, LDLDIRECT  Physical Exam:    VS:  BP 124/74   Pulse 89   Ht '5\' 10"'  (1.778 m)   Wt 215 lb 9.6 oz (97.8 kg)   SpO2 98%   BMI 30.94 kg/m     Wt Readings from Last 3 Encounters:  07/26/20 215 lb 9.6 oz (97.8 kg)  06/06/20 213 lb 9.6 oz (96.9 kg)  05/25/20 207 lb 12.8 oz (94.3 kg)     GEN: Well nourished, well developed in no acute distress HEENT: Normal NECK: No JVD; No carotid bruits LYMPHATICS: No lymphadenopathy CARDIAC: S1S2 noted,RRR, no murmurs, rubs, gallops RESPIRATORY:  Clear to auscultation without rales, wheezing or rhonchi  ABDOMEN: Soft, non-tender, non-distended, +bowel sounds, no  guarding. EXTREMITIES: No edema, No cyanosis, no clubbing MUSCULOSKELETAL:  No deformity  SKIN: Warm and dry NEUROLOGIC:  Alert and oriented x 3, non-focal PSYCHIATRIC:  Normal affect, good insight  ASSESSMENT:    1. Essential hypertension   2. Coronary artery disease involving native coronary artery of native heart without angina pectoris   3. Hypertension, unspecified type   4. Paroxysmal atrial fibrillation (HCC)   5. Mild aortic stenosis   6. Mild mitral regurgitation   7. Ischemic cardiomyopathy   8. Type 2 diabetes mellitus with hyperglycemia, without long-term current use of insulin (HCC)   9. Depressed left ventricular ejection fraction   10. Mixed hyperlipidemia    PLAN:     1.  For now we will keep him on the current medication regimen.  We will hold off on  transitioning him to [facility] and see if he has any improvement on follow-up with his echocardiogram.  If not at which time we will transition the patient to [facility]. He had questions about cutting back on some of his medication.  We went through the list and I was able to explain to the patient that he actually need every single one of the medications he is taking he expresses understanding and plans to be compliant and continue to take these medications.  In terms of his aortic dilatation CT scan will be repeated in a year.  He notes he had recent blood work we will request this from his PCP. The patient understands the need to lose weight with diet and exercise. We have discussed specific strategies for this.  Continue his rate limiting agent along with his Eliquis.  This is being managed by his primary care doctor.  No adjustments for antidiabetic medications were made today.  Is a hyperlipidemia  The patient is in agreement with the above plan. The patient left the office in stable condition.  The patient will follow up in 6 months or sooner if needed.   Medication Adjustments/Labs and Tests  Ordered: Current medicines are reviewed at length with the patient today.  Concerns regarding medicines are outlined above.  No orders of the defined types were placed in this encounter.  No orders of the defined types were placed in this encounter.   Patient Instructions  Medication Instructions:   Your physician recommends that you continue on your current medications as directed. Please refer to the Current Medication list given to you today.  *If you need a refill on your cardiac medications before your next appointment, please call your pharmacy*   Lab Work: None If you have labs (blood work) drawn today and your tests are completely normal, you will receive your results only by: [place] (if you have MyChart) OR A paper copy in the mail If you have any lab test that is abnormal or we need to change your treatment, we will call you to review the results.   Testing/Procedures: None   Follow-Up: At [facility], you and your health needs are our priority.  As part of our continuing mission to provide you with exceptional heart care, we have created designated Provider Care Teams.  These Care Teams include your primary Cardiologist (physician) and Advanced Practice Providers (APPs -  Physician Assistants and Nurse Practitioners) who all work together to provide you with the care you need, when you need it.  We recommend signing up for the patient portal called "MyChart".  Sign up information is provided on this After Visit Summary.  MyChart is used to connect with patients for Virtual Visits (Telemedicine).  Patients are able to view lab/test results, encounter notes, upcoming appointments, etc.  Non-urgent messages can be sent to your provider as well.   To learn more about what you can do with MyChart, go to [email or web address].    Your next appointment:   6 month(s)  The format for your next appointment:   In Person  Provider:   [address] - Berniece Salines,  DO   Other Instructions    Adopting a Healthy Lifestyle.  Know what a healthy weight is for you (roughly BMI <25) and aim to maintain this   Aim for 7+ servings of fruits and vegetables daily   65-80+ fluid ounces of water or unsweet tea for healthy kidneys   Limit to max 1 drink of alcohol per  day; avoid smoking/tobacco   Limit animal fats in diet for cholesterol and heart health - choose grass fed whenever available   Avoid highly processed foods, and foods high in saturated/trans fats   Aim for low stress - take time to unwind and care for your mental health   Aim for 150 min of moderate intensity exercise weekly for heart health, and weights twice weekly for bone health   Aim for 7-9 hours of sleep daily   When it comes to diets, agreement about the perfect plan isnt easy to find, even among the experts. Experts at the [employer] developed an idea known as the Healthy Eating Plate. Just imagine a plate divided into logical, healthy portions.   The emphasis is on diet quality:   Load up on vegetables and fruits - one-half of your plate: Aim for color and variety, and remember that potatoes dont count.   Go for whole grains - one-quarter of your plate: Whole wheat, barley, wheat berries, quinoa, oats, brown rice, and foods made with them. If you want pasta, go with whole wheat pasta.   Protein power - one-quarter of your plate: Fish, chicken, beans, and nuts are all healthy, versatile protein sources. Limit red meat.   The diet, however, does go beyond the plate, offering a few other suggestions.   Use healthy plant oils, such as olive, canola, soy, corn, sunflower and peanut. Check the labels, and avoid partially hydrogenated oil, which have unhealthy trans fats.   If youre thirsty, drink water. Coffee and tea are good in moderation, but skip sugary drinks and limit milk and dairy products to one or two daily servings.   The type of carbohydrate  in the diet is more important than the amount. Some sources of carbohydrates, such as vegetables, fruits, whole grains, and beans-are healthier than others.   Finally, stay active  Signed, Berniece Salines, DO  07/27/2020 9:39 AM    [place]

## 2020-08-01 DIAGNOSIS — K529 Noninfective gastroenteritis and colitis, unspecified: Secondary | ICD-10-CM | POA: Diagnosis not present

## 2020-08-01 DIAGNOSIS — Z20822 Contact with and (suspected) exposure to covid-19: Secondary | ICD-10-CM | POA: Diagnosis not present

## 2020-08-03 ENCOUNTER — Ambulatory Visit (INDEPENDENT_AMBULATORY_CARE_PROVIDER_SITE_OTHER): Payer: Medicare Other

## 2020-08-03 ENCOUNTER — Ambulatory Visit
Admission: EM | Admit: 2020-08-03 | Discharge: 2020-08-03 | Disposition: A | Discharge status: Home / Self Care | Payer: Medicare Other | Attending: Emergency Medicine | Admitting: Emergency Medicine

## 2020-08-03 ENCOUNTER — Encounter: Payer: Self-pay | Admitting: Emergency Medicine

## 2020-08-03 ENCOUNTER — Other Ambulatory Visit: Payer: Self-pay

## 2020-08-03 DIAGNOSIS — R059 Cough, unspecified: Secondary | ICD-10-CM

## 2020-08-03 DIAGNOSIS — R079 Chest pain, unspecified: Secondary | ICD-10-CM

## 2020-08-03 DIAGNOSIS — Z1152 Encounter for screening for COVID-19: Secondary | ICD-10-CM

## 2020-08-03 DIAGNOSIS — J069 Acute upper respiratory infection, unspecified: Secondary | ICD-10-CM

## 2020-08-03 MED ORDER — LORATADINE 10 MG PO TABS: 10.0000 mg | ORAL_TABLET | Freq: Every day | ORAL | 0 refills | Status: AC

## 2020-08-03 MED ORDER — BENZONATATE 200 MG PO CAPS: 200.0000 mg | ORAL_CAPSULE | Freq: Three times a day (TID) | ORAL | 0 refills | Status: AC | PRN

## 2020-08-03 MED ORDER — ALBUTEROL SULFATE HFA 108 (90 BASE) MCG/ACT IN AERS: 1.0000 | INHALATION_SPRAY | Freq: Four times a day (QID) | RESPIRATORY_TRACT | 0 refills | Status: AC | PRN

## 2020-08-03 NOTE — ED Provider Notes (Signed)
[facility]    CSN: [id number] Arrival date & time: 08/03/20  0934      History   Chief Complaint Chief Complaint  Patient presents with   Nasal Congestion   Cough    HPI Austin Gomez is a 75 y.o. male history of CAD, DM type II, hypertension, diastolic CHF, A. fib, presenting today for evaluation of cough congestion and wheezing.  Reports 2 days of cough and congestion.  Denies any known fevers or known close sick contacts.  Denies known COVID exposures, reports COVID test on first day of symptoms.  Reports history of pneumonia.  Appetite slightly decreased, but still tolerating.  Using Mucinex  HPI  Past Medical History:  Diagnosis Date   A-fib ([place])    Coronary artery disease involving native coronary artery of native heart 05/23/2020   Diabetes mellitus without complication ([place])    Essential hypertension 05/23/2020   Hypertension    Mixed hyperlipidemia 05/23/2020   Type 2 diabetes mellitus with hyperglycemia, without long-term current use of insulin ([place]) 05/23/2020    Patient Active Problem List   Diagnosis Date Noted   Depressed left ventricular ejection fraction 06/08/2020   Ischemic cardiomyopathy 06/08/2020   Mild aortic stenosis 06/08/2020   Mild mitral regurgitation 06/08/2020   Benign prostatic hyperplasia with lower urinary tract symptoms 06/06/2020   Hypertensive heart disease with chronic systolic congestive heart failure ([place]) 06/06/2020   Lesion of skin of nose 06/06/2020   Hypertension    Diabetes mellitus without complication (HCC)    A-fib (HCC)    Acute on chronic diastolic CHF (congestive heart failure) ([place]) 05/23/2020   Essential hypertension 05/23/2020   Mixed hyperlipidemia 05/23/2020   Type 2 diabetes mellitus with hyperglycemia, without long-term current use of insulin ([place]) 05/23/2020   Coronary artery disease involving native coronary artery of native heart 05/23/2020   Elevated troponin level not due myocardial infarction  05/23/2020    Past Surgical History:  Procedure Laterality Date   CORONARY ANGIOPLASTY WITH STENT PLACEMENT         Home Medications    Prior to Admission medications   Medication Sig Start Date End Date Taking? Authorizing Provider  benzonatate (TESSALON) 200 MG capsule Take 1 capsule (200 mg total) by mouth 3 (three) times daily as needed for up to 7 days for cough. 08/03/20 08/10/20 Yes Quetzally Callas C, PA-C  loratadine (CLARITIN) 10 MG tablet Take 1 tablet (10 mg total) by mouth daily. 08/03/20  Yes Calianna Kim C, PA-C  atorvastatin (LIPITOR) 40 MG tablet Take 1 tablet (40 mg total) by mouth every evening. 05/25/20 08/23/20  [place] Eric J, DO  blood glucose meter kit and supplies Dispense based on patient and insurance preference. Use up to four times daily as directed. (FOR ICD-10 E10.9, E11.9). 05/25/20   [place] Donnamarie Poag, DO  clopidogrel (PLAVIX) 75 MG tablet Take 1 tablet (75 mg total) by mouth daily. 05/26/20 08/24/20  [place] Donnamarie Poag, DO  DULoxetine (CYMBALTA) 60 MG capsule Take 60 mg by mouth every evening.    [provider]  ELIQUIS 5 MG TABS tablet Take 1 tablet (5 mg total) by mouth 2 (two) times daily. 05/25/20 08/23/20  [place] Donnamarie Poag, DO  furosemide (LASIX) 20 MG tablet Take 1 tablet (20 mg total) by mouth 2 (two) times a week. 06/07/20   Tobb, Kardie, DO  gabapentin (NEURONTIN) 600 MG tablet Take 600 mg by mouth 3 (three) times daily.    [provider]  glipiZIDE (GLUCOTROL)  10 MG tablet Take 2 tablets (20 mg total) by mouth 2 (two) times daily before a meal. 05/25/20 08/23/20  [place] Donnamarie Poag, DO  losartan (COZAAR) 25 MG tablet Take 1 tablet (25 mg total) by mouth daily. 06/12/20 09/10/20  Tobb, Godfrey Pick, DO  metFORMIN (GLUCOPHAGE) 1000 MG tablet Take 1 tablet (1,000 mg total) by mouth 2 (two) times daily with a meal. 05/25/20 08/23/20  [place] Donnamarie Poag, DO  metoprolol succinate (TOPROL-XL) 50 MG 24 hr tablet Take 1 tablet (50 mg total) by mouth daily. Take with or  immediately following a meal. 06/12/20 09/10/20  Tobb, Kardie, DO  Multiple Vitamins-Minerals (MULTIVITAMIN WITH MINERALS) tablet Take 1 tablet by mouth daily.    [provider]  OZEMPIC, 1 MG/DOSE, 4 MG/3ML SOPN Inject 1 mg into the skin once a week. 05/18/20   [provider]    Family History Family History  Problem Relation Age of Onset   Heart disease Neg Hx     Social History Social History   Tobacco Use   Smoking status: Never   Smokeless tobacco: Never     Allergies   Patient has no known allergies.   Review of Systems Review of Systems  Constitutional:  Negative for activity change, appetite change, chills, fatigue and fever.  HENT:  Positive for congestion. Negative for ear pain, rhinorrhea, sore throat and trouble swallowing.   Eyes:  Negative for discharge and redness.  Respiratory:  Positive for cough and wheezing. Negative for chest tightness and shortness of breath.   Cardiovascular:  Negative for chest pain.  Gastrointestinal:  Negative for abdominal pain, diarrhea, nausea and vomiting.  Musculoskeletal:  Negative for myalgias.  Skin:  Negative for rash.  Neurological:  Negative for dizziness, light-headedness and headaches.    Physical Exam Triage Vital Signs ED Triage Vitals  Enc Vitals Group     BP      Pulse      Resp      Temp      Temp src      SpO2      Weight      Height      Head Circumference      Peak Flow      Pain Score      Pain Loc      Pain Edu?      Excl. in [place]?    No data found.  Updated Vital Signs BP 135/76 (BP Location: Left Arm)   Pulse (!) 107   Temp 98.5 F (36.9 C) (Oral)   Resp 20   SpO2 95%   Visual Acuity Right Eye Distance:   Left Eye Distance:   Bilateral Distance:    Right Eye Near:   Left Eye Near:    Bilateral Near:     Physical Exam Vitals and nursing note reviewed.  Constitutional:      Appearance: He is well-developed.     Comments: No acute distress  HENT:     Head:  Normocephalic and atraumatic.     Ears:     Comments: Bilateral ears without tenderness to palpation of external auricle, tragus and mastoid, EAC's without erythema or swelling, TM's with good bony landmarks and cone of light. Non erythematous.      Nose: Nose normal.     Mouth/Throat:     Comments: Oral mucosa pink and moist, no tonsillar enlargement or exudate. Posterior pharynx patent and nonerythematous, no uvula deviation or swelling. Normal phonation.   Eyes:  Conjunctiva/sclera: Conjunctivae normal.  Cardiovascular:     Rate and Rhythm: Normal rate.  Pulmonary:     Effort: Pulmonary effort is normal. No respiratory distress.     Comments: Breathing comfortably at rest, crackles to bilateral bases Abdominal:     General: There is no distension.  Musculoskeletal:        General: Normal range of motion.     Cervical back: Neck supple.  Skin:    General: Skin is warm and dry.  Neurological:     Mental Status: He is alert and oriented to person, place, and time.     UC Treatments / Results  Labs (all labs ordered are listed, but only abnormal results are displayed) Labs Reviewed  NOVEL CORONAVIRUS, NAA    EKG   Radiology DG Chest 2 View  Result Date: 08/03/2020 CLINICAL DATA:  Cough and chest congestion EXAM: CHEST - 2 VIEW COMPARISON:  05/23/2020 FINDINGS: Interstitial coarsening without Kerley lines or effusion. No air bronchogram. Borderline heart size with stable aortic and hilar contours. No acute osseous finding IMPRESSION: Interstitial prominence that could be bronchitic or from vascular congestion. No edema or consolidating pneumonia. Electronically Signed   By: Monte Fantasia M.D.   On: 08/03/2020 10:58    Procedures Procedures (including critical care time)  Medications Ordered in UC Medications - No data to display  Initial Impression / Assessment and Plan / UC Course  I have reviewed the triage vital signs and the nursing notes.  Pertinent labs &  imaging results that were available during my care of the patient were reviewed by me and considered in my medical decision making (see chart for details).     X-ray negative for pneumonia/fluid, treating for likely viral URI/Claritin daily, continue Mucinex, Tessalon every 8 hours for cough, butyryl inhaler as needed for wheezing, shortness of breath chest tightness.  Rest and fluids.  COVID test pending for further rule out.  Discussed strict return precautions. Patient verbalized understanding and is agreeable with plan.  Final Clinical Impressions(s) / UC Diagnoses   Final diagnoses:  Encounter for screening for COVID-19  Viral URI with cough     Discharge Instructions      No pneumonia or fluid on lungs COVID test pending Continue mucinex Add in daily loratadine/Claritin Tessalon every 8 hours for cough Rest and fluids Tylenol as needed Follow-up if not improving or worsening      ED Prescriptions     Medication Sig Dispense Auth. Provider   benzonatate (TESSALON) 200 MG capsule Take 1 capsule (200 mg total) by mouth 3 (three) times daily as needed for up to 7 days for cough. 28 capsule Corday Wyka C, PA-C   loratadine (CLARITIN) 10 MG tablet Take 1 tablet (10 mg total) by mouth daily. 15 tablet Naiyah Klostermann, [place] C, PA-C      PDMP not reviewed this encounter.   Janith Lima, PA-C 08/03/20 1103

## 2020-08-03 NOTE — Discharge Instructions (Addendum)
No pneumonia or fluid on lungs COVID test pending Continue mucinex Add in daily loratadine/Claritin Tessalon every 8 hours for cough Rest and fluids Tylenol as needed Follow-up if not improving or worsening

## 2020-08-03 NOTE — ED Triage Notes (Signed)
Pt presents today along with daughter. Pt speaks some [place], and daughter will interpret for him. [place].  He c/o of chest congestion with cough x 2 days. History of pneumonia and was hospitalized.  At home Covid test performed 2 days ago; negative.

## 2020-08-04 LAB — SARS-COV-2, NAA 2 DAY TAT

## 2020-08-04 LAB — NOVEL CORONAVIRUS, NAA: SARS-CoV-2, NAA: NOT DETECTED

## 2020-08-08 DIAGNOSIS — I110 Hypertensive heart disease with heart failure: Secondary | ICD-10-CM | POA: Diagnosis not present

## 2020-08-08 DIAGNOSIS — I5022 Chronic systolic (congestive) heart failure: Secondary | ICD-10-CM | POA: Diagnosis not present

## 2020-08-08 DIAGNOSIS — I4891 Unspecified atrial fibrillation: Secondary | ICD-10-CM | POA: Diagnosis not present

## 2020-08-08 DIAGNOSIS — K5900 Constipation, unspecified: Secondary | ICD-10-CM | POA: Diagnosis not present

## 2020-08-08 DIAGNOSIS — R053 Chronic cough: Secondary | ICD-10-CM | POA: Diagnosis not present

## 2020-08-08 DIAGNOSIS — R0902 Hypoxemia: Secondary | ICD-10-CM | POA: Diagnosis not present

## 2020-08-08 DIAGNOSIS — R059 Cough, unspecified: Secondary | ICD-10-CM | POA: Diagnosis not present

## 2020-08-08 DIAGNOSIS — I482 Chronic atrial fibrillation, unspecified: Secondary | ICD-10-CM | POA: Diagnosis not present

## 2020-08-14 DIAGNOSIS — I5032 Chronic diastolic (congestive) heart failure: Secondary | ICD-10-CM | POA: Diagnosis not present

## 2020-08-14 DIAGNOSIS — I482 Chronic atrial fibrillation, unspecified: Secondary | ICD-10-CM | POA: Diagnosis not present

## 2020-08-14 DIAGNOSIS — I110 Hypertensive heart disease with heart failure: Secondary | ICD-10-CM | POA: Diagnosis not present

## 2020-08-14 DIAGNOSIS — I5022 Chronic systolic (congestive) heart failure: Secondary | ICD-10-CM | POA: Diagnosis not present

## 2020-08-14 DIAGNOSIS — E119 Type 2 diabetes mellitus without complications: Secondary | ICD-10-CM | POA: Diagnosis not present

## 2020-08-14 DIAGNOSIS — E782 Mixed hyperlipidemia: Secondary | ICD-10-CM | POA: Diagnosis not present

## 2020-08-14 DIAGNOSIS — J22 Unspecified acute lower respiratory infection: Secondary | ICD-10-CM | POA: Diagnosis not present

## 2020-08-14 DIAGNOSIS — R7989 Other specified abnormal findings of blood chemistry: Secondary | ICD-10-CM | POA: Diagnosis not present

## 2020-08-20 DIAGNOSIS — I5020 Unspecified systolic (congestive) heart failure: Secondary | ICD-10-CM | POA: Diagnosis not present

## 2020-08-20 DIAGNOSIS — Z87891 Personal history of nicotine dependence: Secondary | ICD-10-CM | POA: Diagnosis not present

## 2020-08-20 DIAGNOSIS — Z955 Presence of coronary angioplasty implant and graft: Secondary | ICD-10-CM | POA: Diagnosis not present

## 2020-08-20 DIAGNOSIS — Z79899 Other long term (current) drug therapy: Secondary | ICD-10-CM | POA: Diagnosis not present

## 2020-08-20 DIAGNOSIS — E119 Type 2 diabetes mellitus without complications: Secondary | ICD-10-CM | POA: Diagnosis not present

## 2020-08-20 DIAGNOSIS — E785 Hyperlipidemia, unspecified: Secondary | ICD-10-CM | POA: Diagnosis not present

## 2020-08-20 DIAGNOSIS — Z7984 Long term (current) use of oral hypoglycemic drugs: Secondary | ICD-10-CM | POA: Diagnosis not present

## 2020-08-20 DIAGNOSIS — I5030 Unspecified diastolic (congestive) heart failure: Secondary | ICD-10-CM | POA: Diagnosis not present

## 2020-08-20 DIAGNOSIS — I482 Chronic atrial fibrillation, unspecified: Secondary | ICD-10-CM | POA: Diagnosis not present

## 2020-08-20 DIAGNOSIS — I110 Hypertensive heart disease with heart failure: Secondary | ICD-10-CM | POA: Diagnosis not present

## 2020-08-20 DIAGNOSIS — I4819 Other persistent atrial fibrillation: Secondary | ICD-10-CM | POA: Diagnosis not present

## 2020-08-20 DIAGNOSIS — I252 Old myocardial infarction: Secondary | ICD-10-CM | POA: Diagnosis not present

## 2020-08-20 DIAGNOSIS — E782 Mixed hyperlipidemia: Secondary | ICD-10-CM | POA: Diagnosis not present

## 2020-08-20 DIAGNOSIS — I2510 Atherosclerotic heart disease of native coronary artery without angina pectoris: Secondary | ICD-10-CM | POA: Diagnosis not present

## 2020-09-06 DIAGNOSIS — E119 Type 2 diabetes mellitus without complications: Secondary | ICD-10-CM | POA: Diagnosis not present

## 2020-09-06 DIAGNOSIS — I110 Hypertensive heart disease with heart failure: Secondary | ICD-10-CM | POA: Diagnosis not present

## 2020-09-06 DIAGNOSIS — R7989 Other specified abnormal findings of blood chemistry: Secondary | ICD-10-CM | POA: Diagnosis not present

## 2020-09-06 DIAGNOSIS — I5033 Acute on chronic diastolic (congestive) heart failure: Secondary | ICD-10-CM | POA: Diagnosis not present

## 2020-09-06 DIAGNOSIS — I5022 Chronic systolic (congestive) heart failure: Secondary | ICD-10-CM | POA: Diagnosis not present

## 2022-02-18 IMAGING — DX DG CHEST 2V
2 series · 2 of 2 positions shown · non-contrast
Comparison: 05/23/2020

CLINICAL DATA: Cough and chest congestion

EXAM:
CHEST - 2 VIEW

[chest pa]
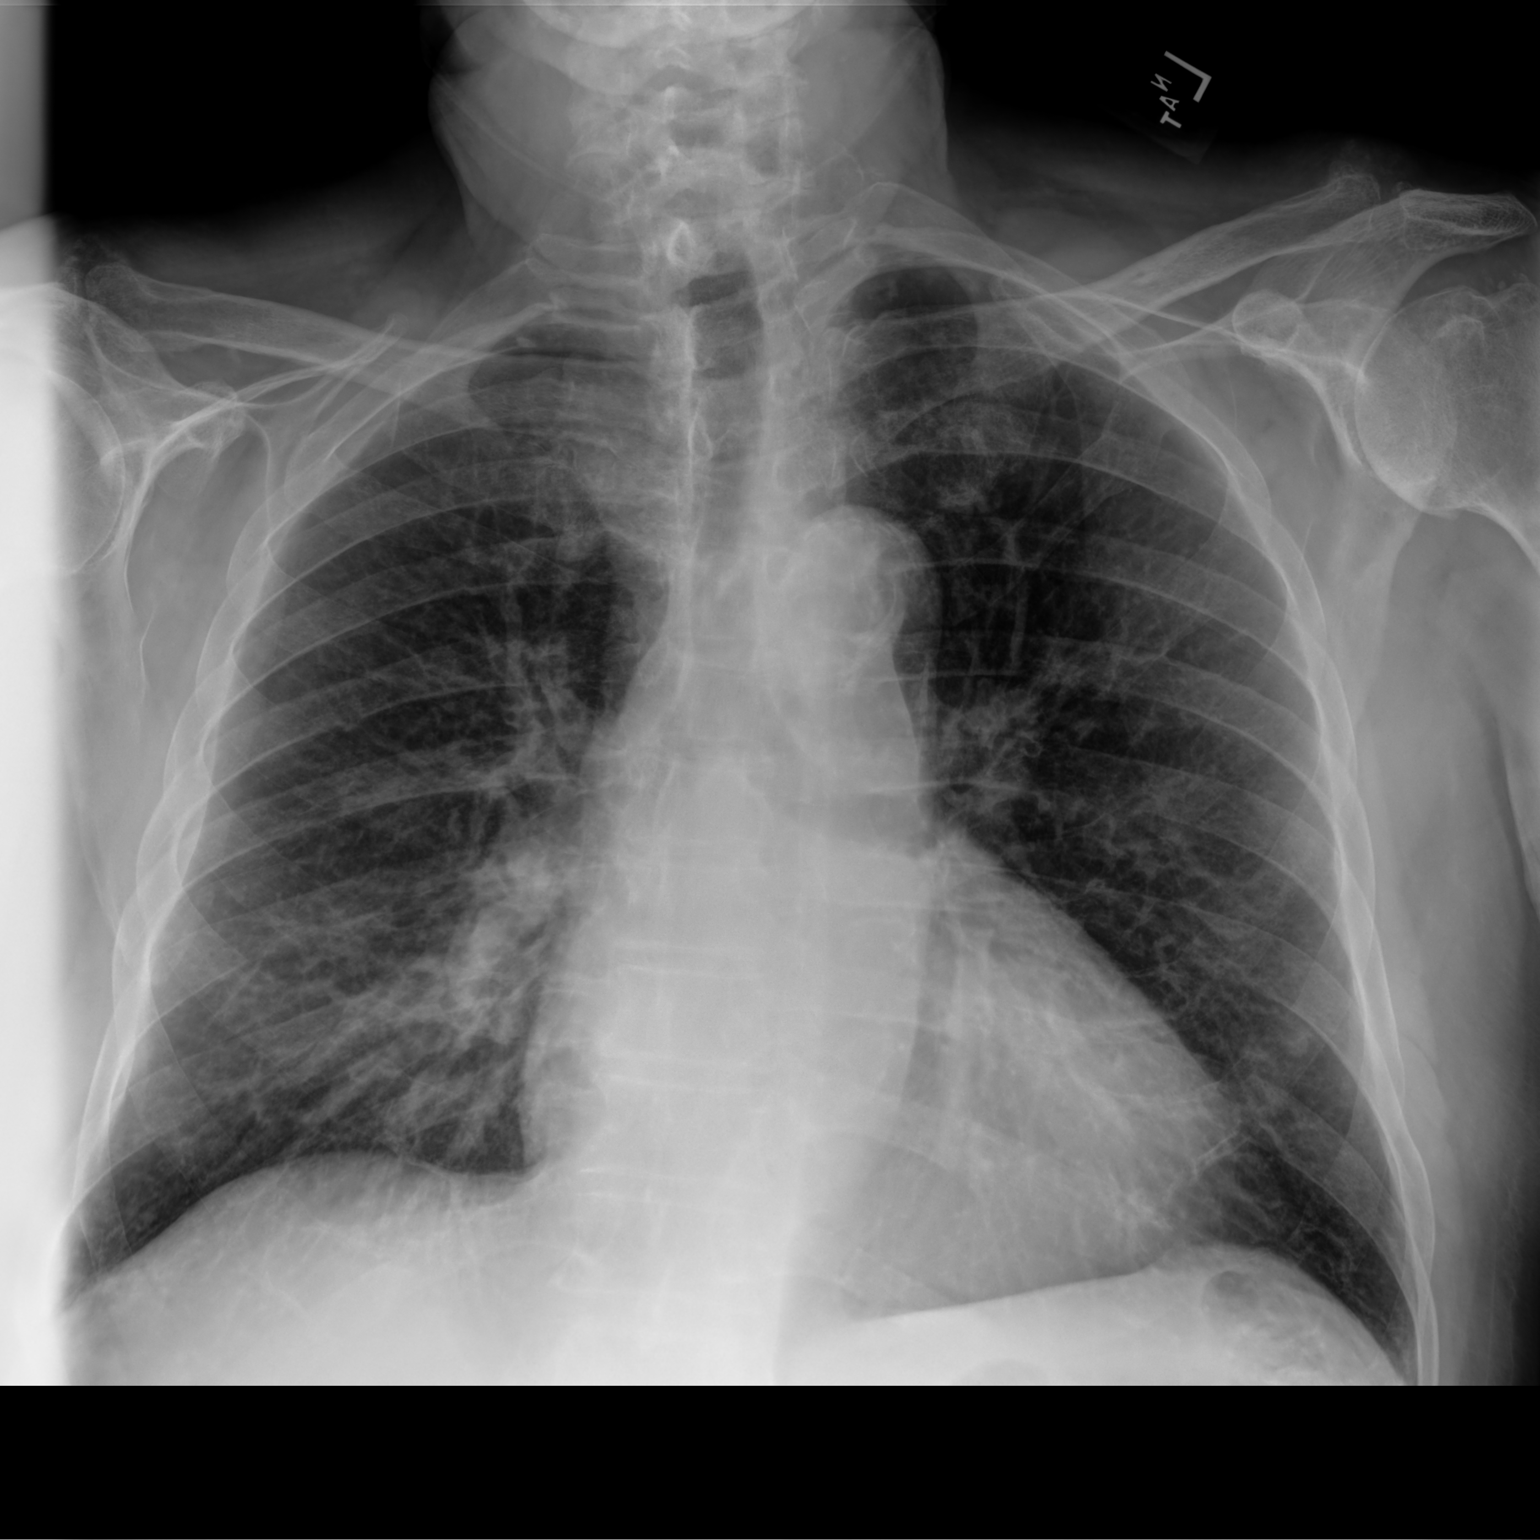

[chest lat]
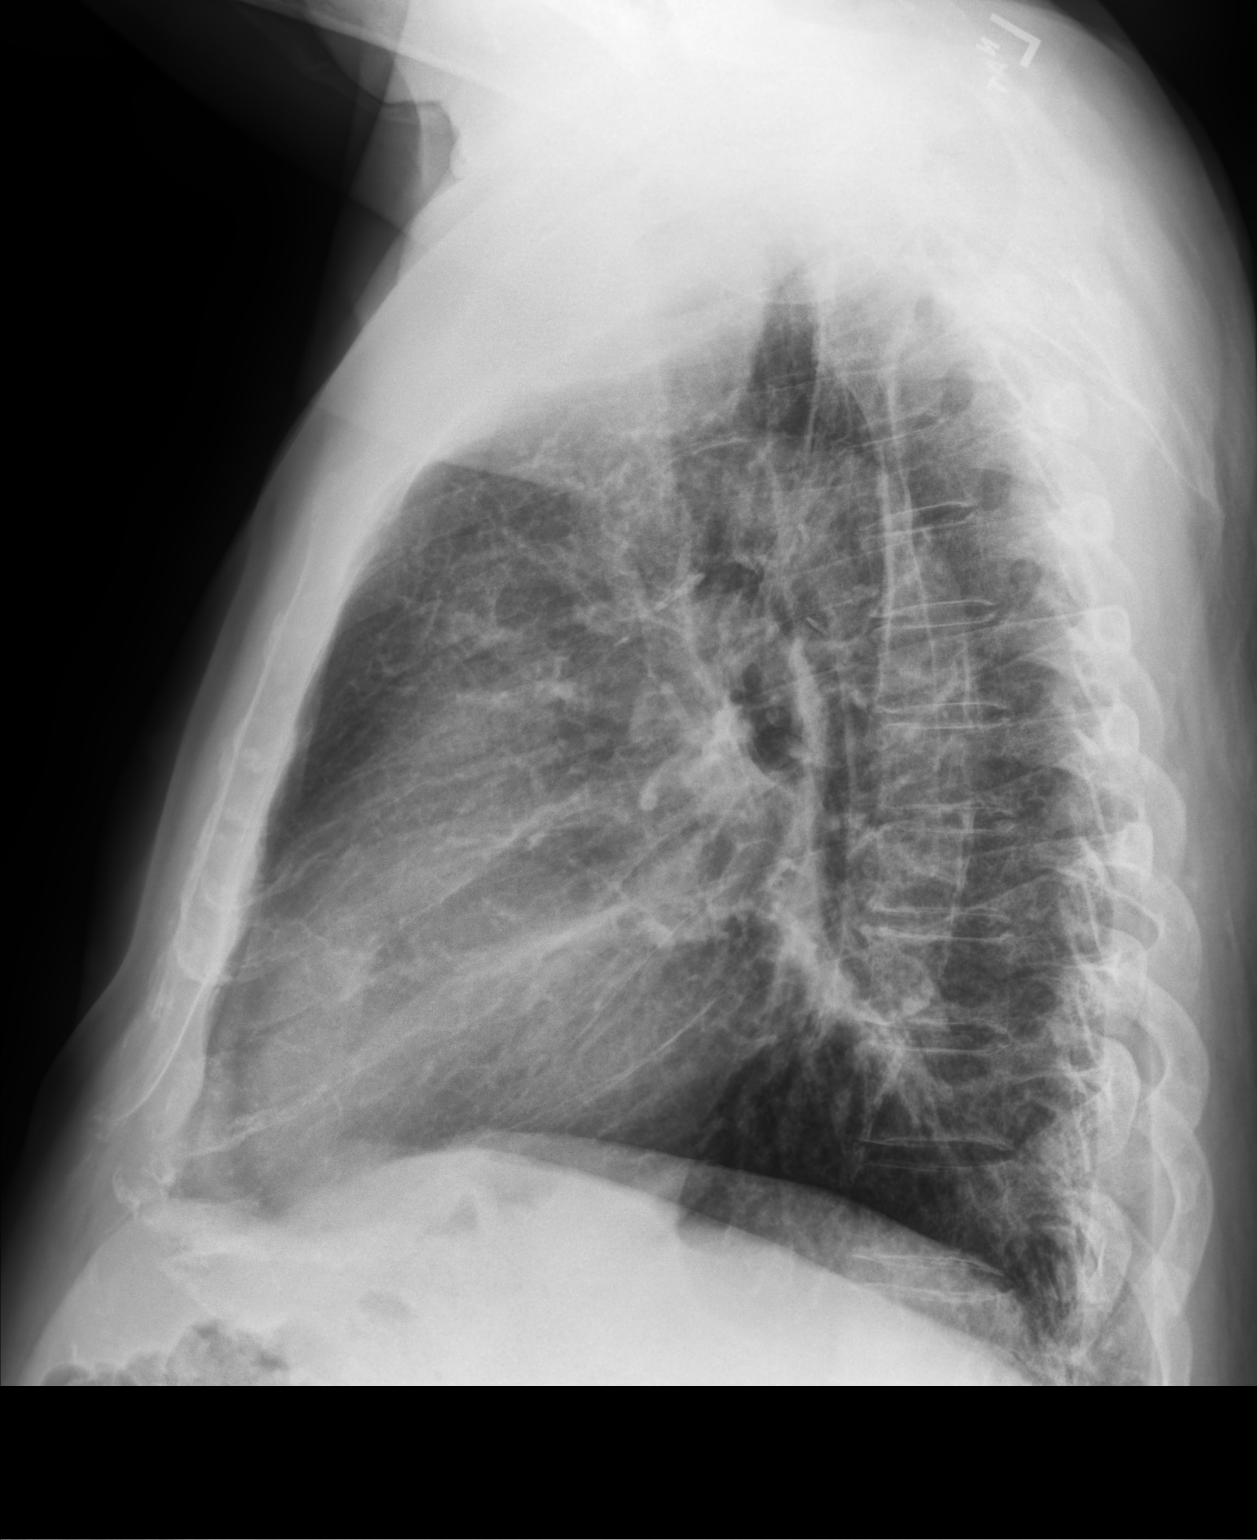

[2 of 2 positions shown; findings below may reference images not displayed]

FINDINGS: Interstitial coarsening without Kerley lines or effusion. No air
bronchogram. Borderline heart size with stable aortic and hilar
contours. No acute osseous finding
IMPRESSION: Interstitial prominence that could be bronchitic or from vascular
congestion. No edema or consolidating pneumonia.
